# Patient Record
Sex: Male | Born: 1985 | Race: White | Hispanic: No | Marital: Single | Smoking: Current every day smoker
Health system: Southern US, Community
[De-identification: ages and names within clinical notes are randomized; demographics above are authoritative.]

## PROBLEM LIST (undated history)

## (undated) DIAGNOSIS — I10 Essential (primary) hypertension: Secondary | ICD-10-CM

## (undated) HISTORY — DX: Essential (primary) hypertension: I10

---
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
## 2004-05-29 ENCOUNTER — Emergency Department (HOSPITAL_COMMUNITY): Admission: EM | Admit: 2004-05-29 | Discharge: 2004-05-29 | Payer: Self-pay | Admitting: Family Medicine

## 2009-09-04 ENCOUNTER — Ambulatory Visit: Payer: Self-pay | Admitting: Occupational Medicine

## 2009-09-04 DIAGNOSIS — J45909 Unspecified asthma, uncomplicated: Secondary | ICD-10-CM | POA: Insufficient documentation

## 2009-09-18 ENCOUNTER — Encounter (INDEPENDENT_AMBULATORY_CARE_PROVIDER_SITE_OTHER): Payer: Self-pay | Admitting: *Deleted

## 2009-09-18 ENCOUNTER — Ambulatory Visit: Payer: Self-pay | Admitting: Occupational Medicine

## 2010-02-03 ENCOUNTER — Ambulatory Visit: Payer: Self-pay | Admitting: Pulmonary Disease

## 2010-02-03 ENCOUNTER — Inpatient Hospital Stay (HOSPITAL_COMMUNITY): Admission: EM | Admit: 2010-02-03 | Discharge: 2010-02-05 | Payer: Self-pay | Admitting: Emergency Medicine

## 2010-02-12 ENCOUNTER — Ambulatory Visit: Payer: Self-pay | Admitting: Pulmonary Disease

## 2010-02-12 ENCOUNTER — Encounter: Payer: Self-pay | Admitting: Pulmonary Disease

## 2010-02-12 DIAGNOSIS — J159 Unspecified bacterial pneumonia: Secondary | ICD-10-CM | POA: Insufficient documentation

## 2010-02-12 DIAGNOSIS — G4730 Sleep apnea, unspecified: Secondary | ICD-10-CM | POA: Insufficient documentation

## 2010-02-12 DIAGNOSIS — J479 Bronchiectasis, uncomplicated: Secondary | ICD-10-CM

## 2010-02-12 DIAGNOSIS — I10 Essential (primary) hypertension: Secondary | ICD-10-CM | POA: Insufficient documentation

## 2010-02-16 ENCOUNTER — Telehealth (INDEPENDENT_AMBULATORY_CARE_PROVIDER_SITE_OTHER): Payer: Self-pay | Admitting: *Deleted

## 2010-03-23 ENCOUNTER — Ambulatory Visit: Payer: Self-pay | Admitting: Pulmonary Disease

## 2010-04-20 ENCOUNTER — Telehealth (INDEPENDENT_AMBULATORY_CARE_PROVIDER_SITE_OTHER): Payer: Self-pay | Admitting: *Deleted

## 2010-04-23 ENCOUNTER — Ambulatory Visit: Payer: Self-pay | Admitting: Pulmonary Disease

## 2010-05-25 ENCOUNTER — Ambulatory Visit: Payer: Self-pay | Admitting: Pulmonary Disease

## 2010-05-25 ENCOUNTER — Telehealth: Payer: Self-pay | Admitting: Pulmonary Disease

## 2010-05-25 DIAGNOSIS — F411 Generalized anxiety disorder: Secondary | ICD-10-CM

## 2010-08-24 ENCOUNTER — Ambulatory Visit: Admit: 2010-08-24 | Payer: Self-pay | Admitting: Pulmonary Disease

## 2010-08-27 ENCOUNTER — Telehealth: Payer: Self-pay | Admitting: Adult Health

## 2010-08-28 NOTE — Progress Notes (Signed)
Summary: change from  symbicort---insurance will not cover  Phone Note Other Incoming   Summary of Call: prior  auth for symbicort 160-/4.5.  Follow-up for Phone Call        called to initiate prior auth for the symbicort---this is not a preferred drug and the insurance will not cover this med for refills---insurance will cover---asmanex, alvesco, qvar, adviar HFA, adviar discus and dulera---which med would you like to change pt to?  please advise. thanks Randell Loop CMA  May 25, 2010 4:37 PM   Additional Follow-up for Phone Call Additional follow up Details #1::        pt has used asmanex without results - pl initiate prior auth - nEEDS symbicort 160/4.5 Additional Follow-up by: Comer Locket. Vassie Loll MD,  May 25, 2010 4:55 PM    Additional Follow-up for Phone Call Additional follow up Details #2::    called back to recheck on the prior auth for the symbicort---they are telling me even though he has used the asmanex before  his insurance will only cover a total of 3 inhalers of the symbicort and there is no way to do a prior auth for the symbicort.  please advise Randell Loop [facility]  May 28, 2010 10:22 AM   ok let's get 3 at least Follow-up by: Comer Locket. Vassie Loll MD,  May 28, 2010 1:57 PM  Additional Follow-up for Phone Call Additional follow up Details #3:: Details for Additional Follow-up Action Taken: called spoke with pharmacist at [facility].  he stated that insurance is still rejecting the symbicort.  leigh do you still have the sheet with the PA info? Boone Master CNA/MA  May 28, 2010 2:34 PM   I called the insurance company again to see what can be done and they gave me a number to customer support at [phone] but they do not open until 10am so WCB. Carron Curie CMA  May 29, 2010 9:21 AM  Spoke to pharmacy support and they state the symbicort needs to be sent through mail order in order to be covered.  Spoke with pt and advvised. He states he has plenty of  inhaler at this time and will call whenhe needs a refill. Carron Curie CMA  May 29, 2010 4:20 PM

## 2010-08-28 NOTE — Letter (Signed)
Summary: Out of Work  [facility]  [address]   [place], [place] [address]   Phone: [phone]  Fax: [phone]    September 18, 2009   Employee:  Roy [employer] [facility]    To Whom It May Concern:   For Medical reasons, please excuse the above named employee from work for the following dates:  Start:   09/18/09  End:   09/21/09  If you need additional information, please feel free to contact our office.         Sincerely,    Logan Bores, MD

## 2010-08-28 NOTE — Progress Notes (Signed)
Summary: symbicort changed to asmanex  Phone Note Call from Patient Call back at [phone]   Caller: Patient Call For: alva Summary of Call: Pt states his insurance won't cover symbicort, needs an alternative, pls advise.//[facility] Initial call taken by: Darletta Moll,  April 20, 2010 8:31 AM  Follow-up for Phone Call        [id number] to advise pt to call ins and get formulary. Carron Curie CMA  April 20, 2010 10:08 AM  per EMR chart, qvar, alvesco and asmanex are on pt's formulary.  pt returned call, informed him that RA will have to approve an alternative inhaler.  pt is completely out of symbicort.  will provide 1 sample until alternative is found.  pt verbalized his understanding.  will forward to RA for recs. Follow-up by: Boone Master CNA/MA,  April 20, 2010 11:15 AM  Additional Follow-up for Phone Call Additional follow up Details #1::        how about advair 250/50 two times a day ? If not , then have him get asmanex 220 2 puffs bid Additional Follow-up by: Comer Locket. Vassie Loll MD,  April 20, 2010 11:30 AM    Additional Follow-up for Phone Call Additional follow up Details #2::    advair off formulary.  symbicort changed to asmanex 220, 2 puffs two times a day.  called spoke with patient, informed him of this.  pt verbalized his understanding.  told pt that since his med is being changed, no sample left up front for pt.  rx sent to [facility]. Follow-up by: Boone Master CNA/MA,  April 20, 2010 11:39 AM  New/Updated Medications: ASMANEX 120 METERED DOSES 220 MCG/INH AEPB (MOMETASONE FUROATE) Inhale 2 puffs two times a day Prescriptions: ASMANEX 120 METERED DOSES 220 MCG/INH AEPB (MOMETASONE FUROATE) Inhale 2 puffs two times a day  #30days x 3   Entered by:   Boone Master CNA/MA   Authorized by:   Comer Locket. Vassie Loll MD   Signed by:   Boone Master CNA/MA on 04/20/2010   Method used:   Electronically to        [facility]*  (retail)       [address] [place], [place]  [address]       Ph: [phone] or [phone]       Fax: [phone]   RxID:   [id number]

## 2010-08-28 NOTE — Assessment & Plan Note (Signed)
Summary: cough, sinus pressure, hoarseness x 2 dys rm 3   Vital Signs:  Patient Profile:   25 Years Old Male CC:      Cold & URI symptoms Height:     71.5 inches Weight:      228 pounds O2 Sat:      97 % O2 treatment:    Room Air Temp:     97.1 degrees F oral Pulse rate:   115 / minute Pulse rhythm:   regular Resp:     16 per minute BP sitting:   148 / 86  (right arm) Cuff size:   regular  Vitals Entered By: Areta Haber CMA (September 18, 2009 4:07 PM)                  Current Allergies: No known allergies History of Present Illness Chief Complaint: Cold & URI symptoms History of Present Illness: Presents with complaints of sore throat, fever, dificulty swallowing for 3 days.   Also complains of sinus congestion and cough.   He was in here about 2 weeks ago with flu like symptoms.   No antibiotc use recently.    Current Problems: STREP THROAT (ICD-034.0) INFLUENZA (ICD-487.8) ASTHMA (ICD-493.90)   Current Meds DAYQUIL MULTI-SYMPTOM 30-325-10 MG/15ML LIQD (PSEUDOEPHEDRINE-APAP-DM) as needed X 3 days SUDAFED 30 MG TABS (PSEUDOEPHEDRINE HCL) as needed X 3 days CHERATUSSIN AC 100-10 MG/5ML SYRP (GUAIFENESIN-CODEINE) 5 ml every 6-8 hours for cough AFRIN NASAL SPRAY 0.05 % SOLN (OXYMETAZOLINE HCL) As directed IBUPROFEN 200 MG TABS (IBUPROFEN) As directed PENICILLIN V POTASSIUM 500 MG TABS (PENICILLIN V POTASSIUM) one tablet every 12 hours for strep throat TESSALON PERLES 100 MG  CAPS (BENZONATATE) one capsule every 6 hours for cough  REVIEW OF SYSTEMS Constitutional Symptoms      Denies fever, chills, night sweats, weight loss, weight gain, and fatigue.  Eyes       Denies change in vision, eye pain, eye discharge, glasses, contact lenses, and eye surgery. Ear/Nose/Throat/Mouth       Complains of frequent runny nose, sinus problems, sore throat, and hoarseness.      Denies hearing loss/aids, change in hearing, ear pain, ear discharge, dizziness, frequent nose bleeds,  and tooth pain or bleeding.      Comments: x 2 dys Respiratory       Complains of wheezing and shortness of breath.      Denies dry cough, productive cough, asthma, bronchitis, and emphysema/COPD.  Cardiovascular       Denies murmurs, chest pain, and tires easily with exhertion.    Gastrointestinal       Denies stomach pain, nausea/vomiting, diarrhea, constipation, blood in bowel movements, and indigestion. Genitourniary       Denies painful urination, kidney stones, and loss of urinary control. Neurological       Denies paralysis, seizures, and fainting/blackouts. Musculoskeletal       Denies muscle pain, joint pain, joint stiffness, decreased range of motion, redness, swelling, muscle weakness, and gout.  Skin       Denies bruising, unusual mles/lumps or sores, and hair/skin or nail changes.  Psych       Denies mood changes, temper/anger issues, anxiety/stress, speech problems, depression, and sleep problems. Other Comments: Pt states he was pulling up floors this weekend that was molded and water logged.    Past History:  Past Medical History: Last updated: 09/04/2009 Asthma  Past Surgical History: Last updated: 09/04/2009 Denies surgical history  Family History: Last updated: 09/04/2009 unknown  Social  History: Last updated: 09/04/2009 Occupation:plumber Single Current Smoker 1pp/week Alcohol use-yes 12 beers/week Drug use-no Regular exercise-no  Risk Factors: Exercise: no (09/04/2009)  Risk Factors: Smoking Status: current (09/04/2009) Physical Exam General appearance: well developed, well nourished, no acute distress Ears: normal, no lesions or deformities Nasal: swollen red turbinates with congestion Oral/Pharynx: pharyngeal erythema with exudate, uvula midline without deviation Chest/Lungs: no rales, wheezes, or rhonchi bilateral, breath sounds equal without effort Heart: regular rate and  rhythm, no murmur Assessment New Problems: STREP THROAT  (ICD-034.0)   Plan New Medications/Changes: TESSALON PERLES 100 MG  CAPS (BENZONATATE) one capsule every 6 hours for cough  #20 x 0, 09/18/2009, Kathrine Haddock MD PENICILLIN V POTASSIUM 500 MG TABS (PENICILLIN V POTASSIUM) one tablet every 12 hours for strep throat  #20 x 0, 09/18/2009, Kathrine Haddock MD  New Orders: Est. Patient Level III [phone]  The patient and/or caregiver has been counseled thoroughly with regard to medications prescribed including dosage, schedule, interactions, rationale for use, and possible side effects and they verbalize understanding.  Diagnoses and expected course of recovery discussed and will return if not improved as expected or if the condition worsens. Patient and/or caregiver verbalized understanding.  Prescriptions: TESSALON PERLES 100 MG  CAPS (BENZONATATE) one capsule every 6 hours for cough  #20 x 0   Entered and Authorized by:   Kathrine Haddock MD   Signed by:   Kathrine Haddock MD on 09/18/2009   Method used:   Print then Give to Patient   RxID:   [id number] PENICILLIN V POTASSIUM 500 MG TABS (PENICILLIN V POTASSIUM) one tablet every 12 hours for strep throat  #20 x 0   Entered and Authorized by:   Kathrine Haddock MD   Signed by:   Kathrine Haddock MD on 09/18/2009   Method used:   Print then Give to Patient   RxID:   [id number]   Patient Instructions: 1)  Take your antibiotic as prescribed until ALL of it is gone, but stop if you develop a rash or swelling and contact our office as soon as possible.

## 2010-08-28 NOTE — Miscellaneous (Signed)
Summary: Orders Update pft charges  Clinical Lists Changes  Orders: Added new Service order of Carbon Monoxide diffusing w/capacity (94720) - Signed Added new Service order of Lung Volumes (94240) - Signed Added new Service order of Spirometry (Pre & Post) (94060) - Signed 

## 2010-08-28 NOTE — Assessment & Plan Note (Signed)
Summary: POSS FLU/TJ   Vital Signs:  Patient Profile:   25 Years Old Male CC:      flu-like symptoms X 3days Height:     71.5 inches Weight:      229 pounds O2 Sat:      98 % O2 treatment:    Room Air Temp:     99.1 degrees F oral Pulse rate:   103 / minute Pulse rhythm:   regular Resp:     18 per minute BP sitting:   152 / 102  (right arm)  Pt. in pain?   yes    Location:   abdomen    Intensity:   5    Type:       sharp  Vitals Entered By: Lita Mains RN                   Updated Prior Medication List: DAYQUIL MULTI-SYMPTOM 30-325-10 MG/15ML LIQD (PSEUDOEPHEDRINE-APAP-DM) as needed X 3 days SUDAFED 30 MG TABS (PSEUDOEPHEDRINE HCL) as needed X 3 days  Current Allergies: No known allergies History of Present Illness History from: patient Chief Complaint: flu-like symptoms X 3days History of Present Illness: Presents with complaints of dry cough, generalized body aches, sinus congestion and diarrhea for 3 days.   No reports of vomiting.    Cough keeping him up at night.   No reports of fever.     REVIEW OF SYSTEMS Constitutional Symptoms       Complains of fever and chills.     Denies night sweats, weight loss, weight gain, and fatigue.  Eyes       Denies change in vision, eye pain, eye discharge, glasses, contact lenses, and eye surgery. Ear/Nose/Throat/Mouth       Complains of ear pain, frequent runny nose, sinus problems, and hoarseness.      Denies hearing loss/aids, change in hearing, ear discharge, dizziness, frequent nose bleeds, sore throat, and tooth pain or bleeding.  Respiratory       Complains of productive cough.      Denies dry cough, wheezing, shortness of breath, asthma, bronchitis, and emphysema/COPD.      Comments: yellow/green Cardiovascular       Denies murmurs, chest pain, and tires easily with exhertion.    Gastrointestinal       Complains of diarrhea.      Denies stomach pain, nausea/vomiting, constipation, blood in bowel movements, and  indigestion. Genitourniary       Denies painful urination, kidney stones, and loss of urinary control. Neurological       Denies paralysis, seizures, and fainting/blackouts. Musculoskeletal       Complains of muscle pain.      Denies joint pain, joint stiffness, decreased range of motion, redness, swelling, muscle weakness, and gout.      Comments: body aches Skin       Denies bruising, unusual mles/lumps or sores, and hair/skin or nail changes.  Psych       Denies mood changes, temper/anger issues, anxiety/stress, speech problems, depression, and sleep problems. Other Comments: Symptoms have lasted three days getting progressively worse.   Past History:  Past Medical History: Asthma  Past Surgical History: Denies surgical history  Family History: unknown  Social History: Occupation:plumber Single Current Smoker 1pp/week Alcohol use-yes 12 beers/week Drug use-no Regular exercise-no Smoking Status:  current Drug Use:  no Does Patient Exercise:  no Physical Exam General appearance: well developed, well nourished, no acute distress Pupils: equal, round, reactive to light Ears:  normal, no lesions or deformities Nasal: mucosa pink, nonedematous, no septal deviation, turbinates normal Oral/Pharynx: tongue normal, posterior pharynx without erythema or exudate Neck: neck supple,  trachea midline, no masses Chest/Lungs: no rales, wheezes, or rhonchi bilateral, breath sounds equal without effort Heart: regular rate and  rhythm, no murmur Assessment New Problems: INFLUENZA (ICD-487.8) ASTHMA (ICD-493.90)   Plan New Medications/Changes: CHERATUSSIN AC 100-10 MG/5ML SYRP (GUAIFENESIN-CODEINE) 5 ml every 6-8 hours for cough  #4 oz x 0, 09/04/2009, Kathrine Haddock MD  New Orders: New Patient Level III [phone] Planning Comments:   supportive care Prescription cough suppressant Off work until [employer]   The patient and/or caregiver has been counseled thoroughly with regard to  medications prescribed including dosage, schedule, interactions, rationale for use, and possible side effects and they verbalize understanding.  Diagnoses and expected course of recovery discussed and will return if not improved as expected or if the condition worsens. Patient and/or caregiver verbalized understanding.  Prescriptions: CHERATUSSIN AC 100-10 MG/5ML SYRP (GUAIFENESIN-CODEINE) 5 ml every 6-8 hours for cough  #4 oz x 0   Entered and Authorized by:   Kathrine Haddock MD   Signed by:   Kathrine Haddock MD on 09/04/2009   Method used:   Print then Give to Patient   RxID:   [id number]
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
## 2010-08-28 NOTE — Letter (Signed)
Summary: Out of Work  [facility]  [address]   [place], [place] [address]   Phone: [phone]  Fax: [phone]    February 12, 2010   Employee:  Roy [employer] [facility]    To Whom It May Concern:   For Medical reasons, please excuse the above named employee from work for the following dates:  Start:   February 03, 2010  End:   February 12, 2010 (may return to work on February 13, 2010)  If you need additional information, please feel free to contact our office.         Sincerely,    Cyril Mourning, M.D.

## 2010-08-28 NOTE — Progress Notes (Signed)
Summary: refill on ecdocet - no refill needed, just records  Phone Note From Other Clinic   Caller: Nurse Call For: Dr. Shelah Lewandowsky Reason for Call: Medication Check Request: Talk with Nurse Details for Reason: refill information Summary of Call: During my lunch time, a message was taken from [place] with Dr. Hennie Duos office. She called stating that they needed refill information so that they could begin filling the medication for the patient.  Follow-up for Phone Call        Dr. Hennie Duos office called again wanting information on the patient. Dr. Wardell Heath also works with Dr. Jeannetta Nap so I faxed the office note so that they can follow up with the patient concerning his medication refill. Follow-up by: Wilder Glade,  February 16, 2010 4:16 PM  Additional Follow-up for Phone Call Additional follow up Details #1::        Dr. Vassie Loll is not going to fill Endocet.Michel Bickers [facility]  February 16, 2010 5:34 PM  Lawson Fiscal, do you have any idea where this message came from?  there are no names attached and no call back numbers.   Boone Master CNA/MA  February 19, 2010 9:10 AM     Additional Follow-up for Phone Call Additional follow up Details #2::    Dena in medical records forwarded it to me because I was working with Dr. Vassie Loll that day. They somehow received the call down there and someone told her to forward it to me. Other than that, I have no information. Dr. Vassie Loll is not going to fill the Endocet.Michel Bickers [facility]  February 19, 2010 9:24 AM  called spoke with Steward Drone at Dr. Levon Hedger office.  she states that she just needed records from our office and that she received them "late Friday afternoon."  nothing else needed per Steward Drone.  will sign off on message. Boone Master CNA/MA  February 19, 2010 9:44 AM

## 2010-08-28 NOTE — Assessment & Plan Note (Signed)
Summary: f/u pft results ///kp   Visit Type:  Follow-up Copy to:  hospital  Primary Provider/Referring Provider:  Dr. Janit Pagan, [facility]  CC:  2 month follow up... Requesting to go back on Symbicort unable to [place] Asmanex.  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 Central bronchectasis with mucoid plugs on CT chest  & high IgE levels , h/o asthma very suggestive of ABPA. However, aspergillus ABs neg. IgE 486, RAST - cat & dog dander extremely high, meadow grass +,  Aspergillus Ab neg,  March 23, 2010 4:17 PM  Was off prednisone x 3 weeks , now symptoms back co-workers sick at work with bronchitis, yellow phlegm, blood tinged, Compliant with symbicort  May 25, 2010 12:16 PM  smokes 5-10 cigs/d, no wheezing, dyspnea Insurance would not approve symbisort,  made him switch to asmanex made him feel wierd, off steroids x 2 months  ran out of klonopin for anxiety - needs for 1 day until PMD fills his RX PFTs showed FEv1 76%, ratio nml, , borderline BD response in smaller airways  Preventive Screening-Counseling & Management  Alcohol-Tobacco     Alcohol drinks/day: <1     Alcohol type: beer     Smoking Status: current     Packs/Day: 0.25     Year Started: 2004     Cans of tobacco/week: 1  Caffeine-Diet-Exercise     Does Patient Exercise: no  Current Medications (verified): 1)  Multivitamins   Tabs (Multiple Vitamin) .... Take 1 Tablet By Mouth Once A Day 2)  Klonopin 0.5 Mg Tabs (Clonazepam) .... Take 1 Tablet By Mouth Every 8 Hours 3)  Atenolol 50 Mg Tabs (Atenolol) .... Take 1 Tablet By Mouth Once A Day 4)  Ventolin Hfa 108 (90 Base) Mcg/act Aers (Albuterol Sulfate) .... As Needed  Allergies (verified): No Known Drug Allergies  Past History:  Past Medical History: Last updated: 02/12/2010 HYPERTENSION  (ICD-401.9) ASTHMA (ICD-493.90)    Social History: Last updated: 09/04/2009 Occupation:plumber Single Current Smoker 1pp/week Alcohol use-yes 12 beers/week Drug use-no Regular exercise-no  Risk Factors: Smoking Status: current (05/25/2010) Packs/Day: 0.25 (05/25/2010) Cans of tobacco/wk: 1 (05/25/2010)  Review of Systems  The patient denies anorexia, fever, weight loss, weight gain, vision loss, decreased hearing, hoarseness, chest pain, syncope, dyspnea on exertion, peripheral edema, prolonged cough, headaches, hemoptysis, abdominal pain, melena, hematochezia, severe indigestion/heartburn, hematuria, muscle weakness, suspicious skin lesions, transient blindness, difficulty walking, depression, unusual weight change, abnormal bleeding, enlarged lymph nodes, and angioedema.    Vital Signs:  Patient profile:   25 year old male Height:      71.5 inches Weight:      227.2 pounds BMI:     31.36 O2 Sat:      95 % on Room air Temp:     98.5 degrees F oral Pulse rate:   102 / minute BP sitting:   130 / 82  (left arm) Cuff size:   large  Vitals Entered By: Zackery Barefoot CMA (May 25, 2010 11:51 AM)  O2 Flow:  Room air CC: 2 month follow up... Requesting to go back on Symbicort unable to [place] Asmanex Comments Medications reviewed with patient Verified contact number and pharmacy with patient Zackery Barefoot [facility]  May 25, 2010 11:53 AM    Physical Exam  Additional Exam:  pleasant, obese 215 lbs --> 227 May 25, 2010  HEENT - no thrush, no post nasal drip, class 3 airway , enlarged tonsils no lymphadenopathy CVS- s1s2 nml, no murmur, no JVD RS- clear, no crackles, faint  rhonchi Abd- soft, non-tender, no organomegaly Ext - no clubbing, cyanosis or edema    Impression & Recommendations:  Problem # 1:  ASTHMA (ICD-493.90) Has not tolerated asmanex Given recurent steroid tapers, does need steroid-LABA combination such as symbicort. 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 psych consult His updated medication list for this problem includes:    Klonopin 0.5 Mg Tabs (Clonazepam) .Marland Kitchen... Take 1 tablet by mouth every 8 hours    Clonazepam 0.5 Mg Tbdp (Clonazepam) .Marland Kitchen... Three times a day as needed  Orders: Est. Patient Level IV (04540) Prescription Created Electronically [id number])  Problem # 4:  OBSTRUCTIVE SLEEP APNEA (ICD-780.57)  sleep study once resp issues resolved  Orders: Est. Patient Level IV (14782) Prescription Created Electronically [phone])  Medications Added to Medication List This Visit: 1)  Klonopin 0.5 Mg Tabs (Clonazepam) .... Take 1 tablet by mouth every 8 hours 2)  Symbicort 160-4.5 Mcg/act Aero (Budesonide-formoterol fumarate) .... 2 puffs two times a day 3)  Clonazepam 0.5 Mg Tbdp (Clonazepam) .... Three times a day as needed  Patient Instructions: 1)  Copy sent to: dr Wardell Heath 2)  Please schedule a follow-up appointment in 3 months with TP. 3)  Symbicort 160  - we will fill out prior authorisation 4)  Do not run out of medications Prescriptions: CLONAZEPAM 0.5 MG TBDP (CLONAZEPAM) three times a day as needed  #6 x 0   Entered and Authorized by:   Comer Locket. Vassie Loll MD   Signed by:   Comer Locket Vassie Loll MD on 05/25/2010   Method used:   Print then Give to Patient   RxID:   [id number] SYMBICORT 160-4.5 MCG/ACT AERO (BUDESONIDE-FORMOTEROL FUMARATE) 2 puffs two times a day  #1 x 5   Entered and Authorized by:   Comer Locket. Vassie Loll MD   Signed by:   Comer Locket Vassie Loll MD on 05/25/2010   Method used:   Electronically to        [facility]* (retail)       [address]  [place], [place]  [address]       Ph: [phone] or [phone]       Fax: [phone]   RxID:   [id number]     Not Administered:    Influenza Vaccine not given due to: declined

## 2010-08-28 NOTE — Progress Notes (Signed)
Summary: refill  Phone Note Call from Patient Call back at [phone]   Caller: Patient Call For: alva Reason for Call: Refill Medication Summary of Call: Refill on endocet 5-325mg .//[facility] Initial call taken by: Darletta Moll,  February 16, 2010 11:04 AM  Follow-up for Phone Call        Pt just given rx for this on 02/12/10- # 20 1 every 12 hrs as needed.  He should not need this filled yet, too soon.  LMOMTCB Vernie Murders  February 16, 2010 11:12 AM  Spoke with pt.  He states that he has been needing to take endocet every 6 hours to help with the pain he is having.  He states that he is used to taking the higher dose of endocet and this is why.  Please advise if okay to refill this or change rx.  Thanks! Follow-up by: Vernie Murders,  February 16, 2010 11:17 AM  Additional Follow-up for Phone Call Additional follow up Details #1::        He needs to call his medical Dr for more meds. he should now be able to get by with OTC meds such as tylenol or motrin/advil Additional Follow-up by: Comer Locket. Vassie Loll MD,  February 16, 2010 12:49 PM    Additional Follow-up for Phone Call Additional follow up Details #2::    Called, spoke with pt.  Pt informed of above statement per RA.  He verbalized understanding.  Gweneth Dimitri RN  February 16, 2010 1:46 PM
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
## 2010-09-05 NOTE — Progress Notes (Signed)
Summary: nos appt  Phone Note Call from Patient   Caller: [email or web address]  Call For: parrett Summary of Call: In ref to nos from 1/27 pt states he will call to [facility]. Initial call taken by: Darletta Moll,  August 27, 2010 10:35 AM

## 2010-10-14 LAB — COMPREHENSIVE METABOLIC PANEL
Albumin: 3.7 g/dL (ref 3.5–5.2)
Alkaline Phosphatase: 82 U/L (ref 39–117)
BUN: 16 mg/dL (ref 6–23)
Calcium: 8 mg/dL — ABNORMAL LOW (ref 8.4–10.5)
Chloride: 108 mEq/L (ref 96–112)
GFR calc non Af Amer: >60 mL/min (ref >60)
Glucose, Bld: 105 mg/dL — ABNORMAL HIGH (ref 70–99)
Sodium: 137 mEq/L (ref 135–145)
Total Bilirubin: 1.5 mg/dL — ABNORMAL HIGH (ref 0.3–1.2)
Total Protein: 6.6 g/dL (ref 6.0–8.3)

## 2010-10-14 LAB — CBC
HCT: 41.3 % (ref 39.0–52.0)
HCT: 42.8 % (ref 39.0–52.0)
Hemoglobin: 14.5 g/dL (ref 13.0–17.0)
Hemoglobin: 15 g/dL (ref 13.0–17.0)
MCH: 32.2 pg (ref 26.0–34.0)
MCH: 32.2 pg (ref 26.0–34.0)
MCHC: 35.1 g/dL (ref 30.0–36.0)
Platelets: 276 10*3/uL (ref 150–400)
RBC: 5.25 MIL/uL (ref 4.22–5.81)
WBC: 13.5 10*3/uL — ABNORMAL HIGH (ref 4.0–10.5)

## 2010-10-14 LAB — ASPERGILLUS ANTIBODY BY IMMUNODIFF: Aspergillus Antibody ID: NEGATIVE

## 2010-10-14 LAB — POCT I-STAT 3, ART BLOOD GAS (G3+)
Acid-base deficit: 6 mmol/L — ABNORMAL HIGH (ref 0.0–2.0)
pCO2 arterial: 45.1 mmHg — ABNORMAL HIGH (ref 35.0–45.0)
pH, Arterial: 7.277 — ABNORMAL LOW (ref 7.350–7.450)
pO2, Arterial: 82 mmHg (ref 80.0–100.0)

## 2010-10-14 LAB — BASIC METABOLIC PANEL
CO2: 23 mEq/L (ref 19–32)
GFR calc non Af Amer: >60 mL/min (ref >60)
GFR calc non Af Amer: >60 mL/min (ref >60)
Glucose, Bld: 146 mg/dL — ABNORMAL HIGH (ref 70–99)
Potassium: 3.9 mEq/L (ref 3.5–5.1)
Potassium: 4 mEq/L (ref 3.5–5.1)
Sodium: 139 mEq/L (ref 135–145)
Sodium: 140 mEq/L (ref 135–145)

## 2010-10-14 LAB — URINE DRUGS OF ABUSE SCREEN W ALC, ROUTINE (REF LAB)
Amphetamine Screen, Ur: NEGATIVE
Cocaine Metabolites: NEGATIVE
Marijuana Metabolite: NEGATIVE
Methadone: NEGATIVE
Propoxyphene: NEGATIVE

## 2010-10-14 LAB — ABO/RH: ABO/RH(D): A NEG

## 2010-10-14 LAB — DIFFERENTIAL
Basophils Absolute: 0.1 10*3/uL (ref 0.0–0.1)
Basophils Relative: 0 % (ref 0–1)
Eosinophils Absolute: 0 10*3/uL (ref 0.0–0.7)
Lymphocytes Relative: 5 % — ABNORMAL LOW (ref 12–46)
Lymphs Abs: 1.3 10*3/uL (ref 0.7–4.0)
Monocytes Relative: 3 % (ref 3–12)
Neutro Abs: 13.1 10*3/uL — ABNORMAL HIGH (ref 1.7–7.7)

## 2010-10-14 LAB — HEPATIC FUNCTION PANEL
ALT: 45 U/L (ref 0–53)
Albumin: 3.4 g/dL — ABNORMAL LOW (ref 3.5–5.2)
Total Bilirubin: 1.5 mg/dL — ABNORMAL HIGH (ref 0.3–1.2)
Total Protein: 6.3 g/dL (ref 6.0–8.3)

## 2010-10-14 LAB — POCT I-STAT, CHEM 8
Chloride: 107 mEq/L (ref 96–112)
Potassium: 5.3 mEq/L — ABNORMAL HIGH (ref 3.5–5.1)
Sodium: 137 mEq/L (ref 135–145)
TCO2: 22 mmol/L (ref 0–100)

## 2010-10-14 LAB — GLUCOSE, CAPILLARY

## 2010-10-14 LAB — CULTURE, BLOOD (ROUTINE X 2)
Culture: NO GROWTH
Culture: NO GROWTH

## 2010-10-14 LAB — LEGIONELLA ANTIGEN, URINE: Legionella Antigen, Urine: NEGATIVE

## 2010-10-14 LAB — OPIATE, QUANTITATIVE, URINE
Codeine Urine: NEGATIVE NG/ML
Hydrocodone: NEGATIVE NG/ML
Hydromorphone GC/MS Conf: NEGATIVE NG/ML
Oxymorphone: NEGATIVE NG/ML

## 2010-10-14 LAB — CORTISOL: Cortisol, Plasma: 35.7 ug/dL

## 2010-10-14 LAB — SEDIMENTATION RATE: Sed Rate: 3 mm/hr (ref 0–16)

## 2010-10-14 LAB — TYPE AND SCREEN: ABO/RH(D): A NEG

## 2010-10-14 LAB — MRSA PCR SCREENING: MRSA by PCR: NEGATIVE

## 2010-12-08 ENCOUNTER — Emergency Department (HOSPITAL_COMMUNITY)
Admission: EM | Admit: 2010-12-08 | Discharge: 2010-12-09 | Disposition: A | Discharge status: Home / Self Care | Payer: Self-pay | Attending: Emergency Medicine | Admitting: Emergency Medicine

## 2010-12-08 DIAGNOSIS — R000 Tachycardia, unspecified: Secondary | ICD-10-CM | POA: Insufficient documentation

## 2010-12-08 DIAGNOSIS — F411 Generalized anxiety disorder: Secondary | ICD-10-CM | POA: Insufficient documentation

## 2010-12-09 LAB — RAPID URINE DRUG SCREEN, HOSP PERFORMED
Barbiturates: NOT DETECTED
Cocaine: POSITIVE — AB
Opiates: NOT DETECTED
Tetrahydrocannabinol: NOT DETECTED

## 2011-09-18 ENCOUNTER — Telehealth: Payer: Self-pay | Admitting: Pulmonary Disease

## 2011-09-18 MED ORDER — BUDESONIDE-FORMOTEROL FUMARATE 160-4.5 MCG/ACT IN AERO: 2.0000 | INHALATION_SPRAY | Freq: Two times a day (BID) | RESPIRATORY_TRACT | Status: DC

## 2011-09-18 NOTE — Telephone Encounter (Signed)
Spoke with pt. Advised overdue for follow up here, and so will need appt scheduled and will give him 1 inhaler to last until then. Pt agrees to keep appt. I have scheduled him to see TP for 09/25/11 at 11:45 am. I sent refill x 1 only.

## 2011-09-24 ENCOUNTER — Encounter: Payer: Self-pay | Admitting: Adult Health

## 2011-09-25 ENCOUNTER — Ambulatory Visit: Payer: Self-pay | Admitting: Adult Health

## 2011-09-27 ENCOUNTER — Ambulatory Visit (INDEPENDENT_AMBULATORY_CARE_PROVIDER_SITE_OTHER): Payer: Self-pay | Admitting: Adult Health

## 2011-09-27 DIAGNOSIS — J159 Unspecified bacterial pneumonia: Secondary | ICD-10-CM

## 2011-10-04 NOTE — Progress Notes (Signed)
No show

## 2012-02-22 ENCOUNTER — Encounter (HOSPITAL_COMMUNITY): Payer: Self-pay | Admitting: *Deleted

## 2012-02-22 ENCOUNTER — Emergency Department (HOSPITAL_COMMUNITY)
Admission: EM | Admit: 2012-02-22 | Discharge: 2012-02-22 | Disposition: A | Discharge status: Home / Self Care | Payer: Self-pay | Attending: Emergency Medicine | Admitting: Emergency Medicine

## 2012-02-22 DIAGNOSIS — J45909 Unspecified asthma, uncomplicated: Secondary | ICD-10-CM | POA: Insufficient documentation

## 2012-02-22 DIAGNOSIS — F17200 Nicotine dependence, unspecified, uncomplicated: Secondary | ICD-10-CM | POA: Insufficient documentation

## 2012-02-22 DIAGNOSIS — I10 Essential (primary) hypertension: Secondary | ICD-10-CM | POA: Insufficient documentation

## 2012-02-22 DIAGNOSIS — F1120 Opioid dependence, uncomplicated: Secondary | ICD-10-CM | POA: Insufficient documentation

## 2012-02-22 MED ORDER — METHADONE HCL 10 MG PO TABS
100.0000 mg | ORAL_TABLET | Freq: Once | ORAL | Status: AC
  Administered 2012-02-22: 100 mg via ORAL
  Filled 2012-02-22: qty 10

## 2012-02-22 MED ORDER — ATENOLOL 50 MG PO TABS: 25.0000 mg | ORAL_TABLET | Freq: Every day | ORAL | Status: DC

## 2012-02-22 NOTE — ED Notes (Signed)
Reports needing his methadone for today. Was suppose to go to [facility] this am but was late and unable to get the meds. No acute distress noted at this time.

## 2012-02-22 NOTE — ED Provider Notes (Signed)
History   This chart was scribed for Gerhard Munch, MD by Charolett Bumpers . The patient was seen in room TR04C/TR04C. Patient's care was started at 11:02.    CSN: [id number]  Arrival date & time 02/22/12  0946   First MD Initiated Contact with Patient 02/22/12 1102      Chief Complaint  Patient presents with  . Medication Refill    (Consider location/radiation/quality/duration/timing/severity/associated sxs/prior treatment) HPI Roy Richmond is a 26 y.o. male who presents to the Emergency Department for a methadone refill. Pt states that he took pain pills for 3 years and has been on the Methadone program for the last year. Pt states that he is on 100 mg methadone daily. Pt states that he was unable to get his methadone this morning at [facility] due to being late. Pt denies any complaints of pain at this time. Pt also reports a h/o HTN and states that he is also out of his Atenolol 25 mg/daily. Pt states that he is otherwise healthy. Pt denies any headaches, pain or confusion.   Past Medical History  Diagnosis Date  . HTN (hypertension)   . Asthma     History reviewed. No pertinent past surgical history.  History reviewed. No pertinent family history.  History  Substance Use Topics  . Smoking status: Current Everyday Smoker -- 0.3 packs/day    Types: Cigarettes  . Smokeless tobacco: Not on file  . Alcohol Use: Yes     12 beers per week      Review of Systems  Constitutional:       Per HPI, otherwise negative  HENT:       Per HPI, otherwise negative  Eyes: Negative.   Respiratory:       Per HPI, otherwise negative  Cardiovascular:       Per HPI, otherwise negative  Gastrointestinal: Negative for vomiting.  Genitourinary: Negative.   Musculoskeletal:       Per HPI, otherwise negative  Skin: Negative.   Neurological: Negative for syncope.    Allergies  Review of patient's allergies indicates no known allergies.  Home Medications   Current  Outpatient Rx  Name Route Sig Dispense Refill  . ATENOLOL 50 MG PO TABS Oral Take 50 mg by mouth daily.    . BUDESONIDE-FORMOTEROL FUMARATE 160-4.5 MCG/ACT IN AERO Inhalation Inhale 2 puffs into the lungs 2 (two) times daily. 1 Inhaler 0    Please advise pt to keep appt for additional refil ...  . METHADONE HCL 10 MG/ML PO CONC Oral Take 100 mg by mouth daily.    . ALBUTEROL SULFATE HFA 108 (90 BASE) MCG/ACT IN AERS Inhalation Inhale 2 puffs into the lungs every 6 (six) hours as needed.      BP 139/74  Pulse 87  Temp 98 F (36.7 C) (Oral)  Resp 16  SpO2 100%  Physical Exam  Nursing note and vitals reviewed. Constitutional: He is oriented to person, place, and time. He appears well-developed. No distress.  HENT:  Head: Normocephalic and atraumatic.  Eyes: Conjunctivae and EOM are normal.  Neck: Normal range of motion.  Cardiovascular: Normal rate, regular rhythm and normal heart sounds.   Pulmonary/Chest: Effort normal and breath sounds normal. No stridor. No respiratory distress.  Abdominal: He exhibits no distension.  Musculoskeletal: He exhibits no edema.  Neurological: He is alert and oriented to person, place, and time.  Skin: Skin is warm and dry.  Psychiatric: He has a normal mood and affect.  ED Course  Procedures (including critical care time)  DIAGNOSTIC STUDIES:   COORDINATION OF CARE:  11:12-Discussed planned course of treatment with the patient, who is agreeable at this time. Pt gave consent to contact [facility].   11:25-Consultation: Spoke with [facility] who confirmed that the pt is one of their patient's and is on a 100 mg Methadone daily.   11:30-Medication Orders: Methadone (Dolophine) tablet 100 mg-once  Labs Reviewed - No data to display No results found.   No diagnosis found.    MDM  I personally performed the services described in this documentation, which was scribed in my presence. The recorded information has been reviewed and  considered.   Is generally well-appearing male presents after missing a methadone clinic appointment.  On exam the patient is in no distress.  The patient's clinic was contacted.  They confirmed the patient is is affiliated area, and discussed today's session.  He was provided today's dose, also provided a refill of his antihypertensive medication.  He was discharged in stable condition.  Gerhard Munch, MD 02/22/12 1221

## 2012-02-29 ENCOUNTER — Emergency Department (HOSPITAL_COMMUNITY)
Admission: EM | Admit: 2012-02-29 | Discharge: 2012-02-29 | Disposition: A | Discharge status: Home / Self Care | Payer: Self-pay | Attending: Emergency Medicine | Admitting: Emergency Medicine

## 2012-02-29 ENCOUNTER — Encounter (HOSPITAL_COMMUNITY): Payer: Self-pay | Admitting: Emergency Medicine

## 2012-02-29 DIAGNOSIS — F1120 Opioid dependence, uncomplicated: Secondary | ICD-10-CM

## 2012-02-29 DIAGNOSIS — F1920 Other psychoactive substance dependence, uncomplicated: Secondary | ICD-10-CM | POA: Insufficient documentation

## 2012-02-29 MED ORDER — METHADONE HCL 10 MG PO TABS
100.0000 mg | ORAL_TABLET | Freq: Once | ORAL | Status: AC
  Administered 2012-02-29: 100 mg via ORAL
  Filled 2012-02-29: qty 10

## 2012-02-29 NOTE — ED Notes (Addendum)
Pt reports working in [place] and missed the [employer] today. Pt denies any pain. Reports feeling hot and cold. Pt reports usually takes medication in the morning. Pt reports that he takes 100 mg once a day. Pt restless in triage. Pt reports Clinic will not be open tomorrow due to being Sunday, so won't have his dose tomorrow either.
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
## 2012-04-27 ENCOUNTER — Ambulatory Visit: Payer: Self-pay | Admitting: Adult Health

## 2012-04-27 ENCOUNTER — Telehealth: Payer: Self-pay | Admitting: Pulmonary Disease

## 2012-04-27 NOTE — Telephone Encounter (Signed)
He needs appt with TP to assess need

## 2012-04-27 NOTE — Telephone Encounter (Signed)
I spoke with pt and is scheduled to come in and see TP today at 4:15

## 2012-04-27 NOTE — Telephone Encounter (Signed)
Pt has not been seen since 05/25/2010 by RA. He cancelled appt with TP on 09/25/11 and NS on 09/27/11. He has no pending appts. Pt was told on 09/18/11 to keep OV with TP for further refills on this medication and never did. Please advise Dr. Vassie Loll if okay to give sample. thanks

## 2012-04-27 NOTE — Telephone Encounter (Signed)
Pt called back again. Roy Richmond °

## 2012-04-28 ENCOUNTER — Ambulatory Visit: Payer: Self-pay | Admitting: Pulmonary Disease

## 2012-05-01 ENCOUNTER — Encounter: Payer: Self-pay | Admitting: Pulmonary Disease

## 2012-05-01 ENCOUNTER — Ambulatory Visit (INDEPENDENT_AMBULATORY_CARE_PROVIDER_SITE_OTHER): Payer: Self-pay | Admitting: Pulmonary Disease

## 2012-05-01 VITALS — BP 130/72 | HR 72 | Temp 98.1°F | Ht 72.0 in | Wt 206.0 lb

## 2012-05-01 DIAGNOSIS — I10 Essential (primary) hypertension: Secondary | ICD-10-CM

## 2012-05-01 DIAGNOSIS — J45909 Unspecified asthma, uncomplicated: Secondary | ICD-10-CM

## 2012-05-01 NOTE — Assessment & Plan Note (Signed)
OK to stay off atenolol - rechk BP few times over next 2 weeks -if high - will use amlodipin instead

## 2012-05-01 NOTE — Patient Instructions (Addendum)
Doxycycline daily x 7days Stay on symbicort 160 2 puffs twice daily Use rescue inhaler albuterol 2 puffs as needed Try to lower dose of methadone Make sure you keep your next appt

## 2012-05-01 NOTE — Assessment & Plan Note (Addendum)
Doxycycline daily x 7days for acute bronchitis Stay on symbicort 160 2 puffs twice daily Use rescue inhaler albuterol 2 puffs as needed Try to lower dose of methadone Spirometry next visit Fact that he has not required prednisone in last 2 y makes me doubt ABPA, although he meets some criteria

## 2012-05-01 NOTE — Progress Notes (Signed)
  Subjective:    Patient ID: Roy Richmond, male    DOB: 06-25-1986, 26 y.o.   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 However, aspergillus ABs neg.  IgE 486, RAST - cat & dog dander extremely high, meadow grass +, Aspergillus Ab neg,    May 25, 2010   PFTs showed FEv1 76%, ratio nml, , borderline BD response in smaller airways  05/01/2012 2 y fu  c/o chest congestion, wheezing, cough w/ brown-green-yellow x 1 week.  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 Also denies presyncope, palpitations, heartburn, abdominal pain, nausea, vomiting, diarrhea or change in bowel or urinary habits, dysuria,hematuria, rash, arthralgias, visual complaints, headache, numbness weakness or ataxia.     Objective:   Physical Exam  Gen. Pleasant, well-nourished, in no distress, normal affect ENT - no lesions, no post nasal drip Neck: No JVD, no thyromegaly, no carotid bruits Lungs: no use of accessory muscles, no dullness to percussion, faint exp rhonchi  Cardiovascular: Rhythm regular, heart sounds  normal, no murmurs or gallops, no peripheral edema Abdomen: soft and non-tender, no  hepatosplenomegaly, BS normal. Musculoskeletal: No deformities, no cyanosis or clubbing Neuro:  alert, non focal       Assessment & Plan:

## 2012-05-02 ENCOUNTER — Emergency Department (HOSPITAL_COMMUNITY)
Admission: EM | Admit: 2012-05-02 | Discharge: 2012-05-02 | Disposition: A | Discharge status: Home / Self Care | Payer: Self-pay | Attending: Emergency Medicine | Admitting: Emergency Medicine

## 2012-05-02 ENCOUNTER — Encounter (HOSPITAL_COMMUNITY): Payer: Self-pay | Admitting: Emergency Medicine

## 2012-05-02 DIAGNOSIS — F17200 Nicotine dependence, unspecified, uncomplicated: Secondary | ICD-10-CM | POA: Insufficient documentation

## 2012-05-02 DIAGNOSIS — I10 Essential (primary) hypertension: Secondary | ICD-10-CM | POA: Insufficient documentation

## 2012-05-02 DIAGNOSIS — G8929 Other chronic pain: Secondary | ICD-10-CM | POA: Insufficient documentation

## 2012-05-02 DIAGNOSIS — F1920 Other psychoactive substance dependence, uncomplicated: Secondary | ICD-10-CM | POA: Insufficient documentation

## 2012-05-02 DIAGNOSIS — J45901 Unspecified asthma with (acute) exacerbation: Secondary | ICD-10-CM

## 2012-05-02 DIAGNOSIS — F1120 Opioid dependence, uncomplicated: Secondary | ICD-10-CM

## 2012-05-02 DIAGNOSIS — J9801 Acute bronchospasm: Secondary | ICD-10-CM | POA: Insufficient documentation

## 2012-05-02 MED ORDER — DOXYCYCLINE HYCLATE 100 MG PO CAPS: 100.0000 mg | ORAL_CAPSULE | Freq: Two times a day (BID) | ORAL | Status: DC

## 2012-05-02 MED ORDER — METHADONE HCL 10 MG PO TABS
100.0000 mg | ORAL_TABLET | Freq: Once | ORAL | Status: AC
  Administered 2012-05-02: 100 mg via ORAL
  Filled 2012-05-02: qty 10

## 2012-05-02 MED ORDER — PREDNISONE 20 MG PO TABS: ORAL_TABLET | ORAL | Status: DC

## 2012-05-02 NOTE — ED Notes (Signed)
Pt reports that he missed his appointment at the [facility] clinic today. Pt reports takes 105 mg of methadone a day. Pt had dose yesterday.

## 2012-05-02 NOTE — ED Provider Notes (Signed)
History   This chart was scribed for Roy Horn, MD by Melba Coon. The patient was seen in room TR07C/TR07C and the patient's care was started at 1:01PM.    CSN: [id number]  Arrival date & time 05/02/12  1034   None     Chief Complaint  Patient presents with  . Medication Management    (Consider location/radiation/quality/duration/timing/severity/associated sxs/prior treatment) The history is provided by the patient. No language interpreter was used.   Roy Richmond is a 26 y.o. male who presents to the Emergency Department complaining of missing his appointment at his methadone clinic today for his lower back pain and is requesting methadone here at the ED. He takes 105 mg/day and had yesterday's dose. However, he complains of inflexible hours and schedule by the clinic. Also c/o wheezing, cough, congestion, SOB c/w Hx of asthma and tried to refill meds that his PCP Rx which did not get sent to the pharmacy. Denies HA, fever, neck pain, sore throat, rash, back pain, CP, abd pain, n/v/d, dysuria, or extremity pain, edema, weakness, numbness, or tingling. No known allergies. No other pertinent medical symptoms.   Past Medical History  Diagnosis Date  . HTN (hypertension)   . Asthma     History reviewed. No pertinent past surgical history.  No family history on file.  History  Substance Use Topics  . Smoking status: Current Every Day Smoker -- 0.5 packs/day    Types: Cigarettes  . Smokeless tobacco: Not on file  . Alcohol Use: Yes     12 beers per week. Quit 04/18/2012      Review of Systems 10 Systems reviewed and all are negative for acute change except as noted in the HPI.   Allergies  Review of patient's allergies indicates no known allergies.  Home Medications   Current Outpatient Rx  Name Route Sig Dispense Refill  . BUDESONIDE-FORMOTEROL FUMARATE 160-4.5 MCG/ACT IN AERO Inhalation Inhale 2 puffs into the lungs 2 (two) times daily. 1 Inhaler 0   Please advise pt to keep appt for additional refil ...  . METHADONE HCL 10 MG/ML PO CONC Oral Take 105 mg by mouth daily.     Marland Kitchen DOXYCYCLINE HYCLATE 100 MG PO CAPS Oral Take 1 capsule (100 mg total) by mouth 2 (two) times daily. One po bid x 7 days 14 capsule 0  . PREDNISONE 20 MG PO TABS  3 tabs po day one, then 2 po daily x 4 days 11 tablet 0    BP 146/77  Pulse 81  Temp 98 F (36.7 C) (Oral)  Resp 16  SpO2 95%  Physical Exam  Nursing note and vitals reviewed. Constitutional:       Awake, alert, nontoxic appearance.  HENT:  Head: Atraumatic.  Eyes: Right eye exhibits no discharge. Left eye exhibits no discharge.  Neck: Neck supple.  Pulmonary/Chest: Effort normal. He has wheezes (mild wheezing and rhonchi). He exhibits no tenderness.  Abdominal: Soft. There is no tenderness. There is no rebound.  Musculoskeletal: He exhibits tenderness (chronic lower lumbar).       Baseline ROM, no obvious new focal weakness.  Neurological:       Mental status and motor strength appears baseline for patient and situation.  Skin: No rash noted.  Psychiatric: He has a normal mood and affect.    ED Course  Procedures (including critical care time)   COORDINATION OF CARE:  1:05PM - pt will be Rx prednisone, inhaler, and doxycycline. One time dose of  methadone will be ordered for the pt. Mr Purdom is ready for d/c.   Labs Reviewed - No data to display No results found.   1. Bronchospasm   2. Asthma attack   3. Chronic pain   4. Chronic narcotic dependence       MDM  Patient / Family / Caregiver informed of clinical course, understand medical decision-making process, and agree with plan. I personally performed the services described in this documentation, which was scribed in my presence. The recorded information has been reviewed and considered. I doubt any other EMC precluding discharge at this time including, but not necessarily limited to the following:SBI.        Roy Horn, MD 05/04/12 [phone]

## 2012-07-29 ENCOUNTER — Encounter (HOSPITAL_COMMUNITY): Payer: Self-pay | Admitting: *Deleted

## 2012-07-29 ENCOUNTER — Emergency Department (HOSPITAL_COMMUNITY)
Admission: EM | Admit: 2012-07-29 | Discharge: 2012-07-29 | Disposition: A | Discharge status: Home Health | Payer: Self-pay | Attending: Emergency Medicine | Admitting: Emergency Medicine

## 2012-07-29 DIAGNOSIS — Z79899 Other long term (current) drug therapy: Secondary | ICD-10-CM | POA: Insufficient documentation

## 2012-07-29 DIAGNOSIS — Z760 Encounter for issue of repeat prescription: Secondary | ICD-10-CM | POA: Insufficient documentation

## 2012-07-29 DIAGNOSIS — F1110 Opioid abuse, uncomplicated: Secondary | ICD-10-CM | POA: Insufficient documentation

## 2012-07-29 DIAGNOSIS — F1120 Opioid dependence, uncomplicated: Secondary | ICD-10-CM

## 2012-07-29 DIAGNOSIS — F17200 Nicotine dependence, unspecified, uncomplicated: Secondary | ICD-10-CM | POA: Insufficient documentation

## 2012-07-29 DIAGNOSIS — J45909 Unspecified asthma, uncomplicated: Secondary | ICD-10-CM | POA: Insufficient documentation

## 2012-07-29 DIAGNOSIS — I10 Essential (primary) hypertension: Secondary | ICD-10-CM | POA: Insufficient documentation

## 2012-07-29 MED ORDER — METHADONE HCL 10 MG/ML PO CONC: 50.0000 mg | Freq: Every day | ORAL | Status: DC

## 2012-07-29 MED ORDER — METHADONE HCL 10 MG PO TABS
50.0000 mg | ORAL_TABLET | Freq: Every day | ORAL | Status: DC
  Administered 2012-07-29: 50 mg via ORAL
  Filled 2012-07-29: qty 5

## 2012-07-29 NOTE — ED Notes (Signed)
To ED for eval after missing Methadone dose from [facility] tx center. Pt states he has come to ED prior when missing doses. Center is closed today

## 2012-07-29 NOTE — ED Provider Notes (Signed)
History     CSN: [id number]  Arrival date & time 07/29/12  0711   First MD Initiated Contact with Patient 07/29/12 0725      Chief Complaint  Patient presents with  . Medication Refill    (Consider location/radiation/quality/duration/timing/severity/associated sxs/prior treatment) HPI  27 year old male with history of narcotic abuse currently taking methadone presents to the ER requesting for methadone treatment. Patient reports he missed his methadone dose from [facility] this AM because clinic hr were shorter than usual.  Last dose with yesterday morning.  Pt reports he has steadily decreased methadone dose to 50mg /day.  Currently denies any specific complaints except feeling agitated, and jittery.  No fever, cp, sob, abd pain, n/v/d, or rash.    Past Medical History  Diagnosis Date  . HTN (hypertension)   . Asthma     History reviewed. No pertinent past surgical history.  History reviewed. No pertinent family history.  History  Substance Use Topics  . Smoking status: Current Every Day Smoker -- 0.5 packs/day    Types: Cigarettes  . Smokeless tobacco: Not on file  . Alcohol Use: Yes     Comment: 12 beers per week. Quit 04/18/2012      Review of Systems  Constitutional:       10 Systems reviewed and all are negative for acute change except as noted in the HPI.       Allergies  Review of patient's allergies indicates no known allergies.  Home Medications   Current Outpatient Rx  Name  Route  Sig  Dispense  Refill  . BUDESONIDE-FORMOTEROL FUMARATE 160-4.5 MCG/ACT IN AERO   Inhalation   Inhale 2 puffs into the lungs 2 (two) times daily.   1 Inhaler   0     Please advise pt to keep appt for additional refil ...   . METHADONE HCL 10 MG/ML PO CONC   Oral   Take 50 mg by mouth daily.            BP 149/96  Pulse 100  Temp 98.2 F (36.8 C)  Resp 16  SpO2 98%  Physical Exam  Nursing note and vitals reviewed. Constitutional: He is  oriented to person, place, and time. He appears well-developed and well-nourished.       Face is flushed, mildly diaphoretic.    HENT:  Head: Normocephalic and atraumatic.  Mouth/Throat: Oropharynx is clear and moist.  Eyes: Conjunctivae normal are normal.  Neck: Normal range of motion. Neck supple.  Cardiovascular: Normal rate and regular rhythm.   Pulmonary/Chest: Breath sounds normal. He has no wheezes. He exhibits no tenderness.  Abdominal: Soft. There is no tenderness.  Neurological: He is alert and oriented to person, place, and time.  Skin: Skin is warm. No rash noted.  Psychiatric: He has a normal mood and affect.    ED Course  Procedures (including critical care time)  Labs Reviewed - No data to display No results found.   No diagnosis found.  1. Medication refill, methadone  MDM  Pt currently attending methadone clinic but missing his appointment this AM.  He has been to ER several times in the past to receive methadone dose when he missed it. He has been making moderate improvement with decreasing methadone dose.  Last seen in ER in November he requires methadone 105mg , however his dose has decreased to 50mg  today.  I discussed with my attending, we agree to give pt 1 time dose.  We strongly encourage pt to  not miss future appointment as the ER is not the appropriate place for methadone treatment.  Pt voice understanding and agrees with plan.  No other concerning problem today.    BP 149/96  Pulse 100  Temp 98.2 F (36.8 C)  Resp 16  SpO2 98%       Fayrene Helper, PA-C 07/29/12 [phone]

## 2012-07-29 NOTE — ED Provider Notes (Signed)
Medical screening examination/treatment/procedure(s) were performed by non-physician practitioner and as supervising physician I was immediately available for consultation/collaboration.   Flint Melter, MD 07/29/12 [phone]

## 2012-08-03 ENCOUNTER — Emergency Department (HOSPITAL_COMMUNITY)
Admission: EM | Admit: 2012-08-03 | Discharge: 2012-08-03 | Disposition: A | Discharge status: Home / Self Care | Payer: Self-pay | Attending: Emergency Medicine | Admitting: Emergency Medicine

## 2012-08-03 ENCOUNTER — Encounter (HOSPITAL_COMMUNITY): Payer: Self-pay | Admitting: Cardiology

## 2012-08-03 DIAGNOSIS — I10 Essential (primary) hypertension: Secondary | ICD-10-CM | POA: Insufficient documentation

## 2012-08-03 DIAGNOSIS — J45909 Unspecified asthma, uncomplicated: Secondary | ICD-10-CM | POA: Insufficient documentation

## 2012-08-03 DIAGNOSIS — F17200 Nicotine dependence, unspecified, uncomplicated: Secondary | ICD-10-CM | POA: Insufficient documentation

## 2012-08-03 DIAGNOSIS — F1120 Opioid dependence, uncomplicated: Secondary | ICD-10-CM | POA: Insufficient documentation

## 2012-08-03 DIAGNOSIS — Z79899 Other long term (current) drug therapy: Secondary | ICD-10-CM | POA: Insufficient documentation

## 2012-08-03 MED ORDER — METHADONE HCL 10 MG PO TABS
50.0000 mg | ORAL_TABLET | Freq: Every day | ORAL | Status: DC
  Administered 2012-08-03: 50 mg via ORAL
  Filled 2012-08-03: qty 5

## 2012-08-03 NOTE — ED Notes (Signed)
Pt reports he missed his MD appt and is out of his Methadone at home. No other complaints.

## 2012-08-03 NOTE — ED Notes (Signed)
The pt reports that he is only waiting on methadone he does not need anything else

## 2012-08-03 NOTE — ED Notes (Signed)
Pt states he missed his methadone dose today had the stomach flu and was not able to get there

## 2012-08-03 NOTE — ED Provider Notes (Signed)
History   This chart was scribed for non-physician practitioner working with Laray Anger, DO by Gerlean Ren, ED Scribe. This patient was seen in room TR10C/TR10C and the patient's care was started at 3:11 PM.      CSN: [id number]  Arrival date & time 08/03/12  1021   First MD Initiated Contact with Patient 08/03/12 1507      Chief Complaint  Patient presents with  . out of meds     The history is provided by the patient. No language interpreter was used.   WYATT THORSTENSON is a 27 y.o. male with h/o HTN and asthma who presents to the Emergency Department because he is out of Methadone and states he was not able to fill it at scheduled MD appointment.  Pt has come here on several occasional different occasions for the same reason.  Pt attends methadone clinic daily and has had his dosage gradually reduced.  Pt denies abdominal pain, chest pain.  Pt states he becomes nauseated and vomits after every meal but denies any nausea or emesis when not eating.  Pt is a current everyday smoker and denies alcohol use.  Past Medical History  Diagnosis Date  . HTN (hypertension)   . Asthma     History reviewed. No pertinent past surgical history.  History reviewed. No pertinent family history.  History  Substance Use Topics  . Smoking status: Current Every Day Smoker -- 0.5 packs/day    Types: Cigarettes  . Smokeless tobacco: Not on file  . Alcohol Use: Yes     Comment: 12 beers per week. Quit 04/18/2012      Review of Systems  Cardiovascular: Negative for chest pain.  Gastrointestinal: Negative for abdominal pain.    Allergies  Review of patient's allergies indicates no known allergies.  Home Medications   Current Outpatient Rx  Name  Route  Sig  Dispense  Refill  . BUDESONIDE-FORMOTEROL FUMARATE 160-4.5 MCG/ACT IN AERO   Inhalation   Inhale 2 puffs into the lungs daily.         Marland Kitchen METHADONE HCL 10 MG/ML PO CONC   Oral   Take 50 mg by mouth daily.             BP 131/76  Pulse 92  Temp 98.1 F (36.7 C) (Oral)  Resp 18  SpO2 97%  Physical Exam  Nursing note and vitals reviewed. Constitutional: He is oriented to person, place, and time. He appears well-developed and well-nourished. No distress.  HENT:  Head: Normocephalic and atraumatic.  Eyes: Conjunctivae normal are normal.  Neck: Neck supple. No tracheal deviation present.  Cardiovascular: Normal rate.   Pulmonary/Chest: Effort normal. No respiratory distress.  Musculoskeletal: Normal range of motion.  Neurological: He is alert and oriented to person, place, and time.  Skin: Skin is warm and dry.  Psychiatric: He has a normal mood and affect. His behavior is normal.    ED Course  Procedures (including critical care time) DIAGNOSTIC STUDIES: Oxygen Saturation is 97% on room air, adequate by my interpretation.    COORDINATION OF CARE: 3:14 PM- Patient informed of clinical course, understands medical decision-making process, and agrees with plan.  Ordered PO Dolophine 50mg  at bedside.  Labs Reviewed - No data to display No results found.   No diagnosis found.  1. ER visit for methadone dose  MDM  Pt with mult. ER visits requesting for methadone dose, since he misses his dose.  Last dose yesterday.  I specifically warn  patient that he cannot continues to come to ER for dosing and must try to make it to his appointment.  Pt voice understanding.     BP 131/76  Pulse 92  Temp 98.1 F (36.7 C) (Oral)  Resp 18  SpO2 97%     I personally performed the services described in this documentation, which was scribed in my presence. The recorded information has been reviewed and is accurate.     Fayrene Helper, PA-C 08/03/12 1556

## 2012-08-05 NOTE — ED Provider Notes (Signed)
Medical screening examination/treatment/procedure(s) were performed by non-physician practitioner and as supervising physician I was immediately available for consultation/collaboration.   Abbygael Curtiss M Leyani Gargus, DO 08/05/12 1006 

## 2012-09-04 ENCOUNTER — Ambulatory Visit: Payer: Self-pay | Admitting: Adult Health

## 2013-02-26 ENCOUNTER — Telehealth: Payer: Self-pay | Admitting: Pulmonary Disease

## 2013-02-26 MED ORDER — BUDESONIDE-FORMOTEROL FUMARATE 160-4.5 MCG/ACT IN AERO: 2.0000 | INHALATION_SPRAY | Freq: Two times a day (BID) | RESPIRATORY_TRACT | Status: DC

## 2013-02-26 NOTE — Telephone Encounter (Signed)
Pt scheduled appt with TP. I left 1 sample of symbicort upfront for p/u. Nothing further was needed

## 2013-02-28 ENCOUNTER — Emergency Department (HOSPITAL_COMMUNITY)
Admission: EM | Admit: 2013-02-28 | Discharge: 2013-02-28 | Disposition: A | Discharge status: Home / Self Care | Payer: 59 | Attending: Emergency Medicine | Admitting: Emergency Medicine

## 2013-02-28 ENCOUNTER — Encounter (HOSPITAL_COMMUNITY): Payer: Self-pay | Admitting: *Deleted

## 2013-02-28 DIAGNOSIS — J45909 Unspecified asthma, uncomplicated: Secondary | ICD-10-CM | POA: Insufficient documentation

## 2013-02-28 DIAGNOSIS — F1120 Opioid dependence, uncomplicated: Secondary | ICD-10-CM | POA: Insufficient documentation

## 2013-02-28 DIAGNOSIS — I10 Essential (primary) hypertension: Secondary | ICD-10-CM | POA: Insufficient documentation

## 2013-02-28 DIAGNOSIS — Z79899 Other long term (current) drug therapy: Secondary | ICD-10-CM | POA: Insufficient documentation

## 2013-02-28 DIAGNOSIS — F17200 Nicotine dependence, unspecified, uncomplicated: Secondary | ICD-10-CM | POA: Insufficient documentation

## 2013-02-28 MED ORDER — METHADONE HCL 10 MG PO TABS
40.0000 mg | ORAL_TABLET | Freq: Once | ORAL | Status: AC
  Administered 2013-02-28: 40 mg via ORAL
  Filled 2013-02-28: qty 4

## 2013-02-28 NOTE — ED Provider Notes (Signed)
  CSN: [id number]     Arrival date & time 02/28/13  0803 History     First MD Initiated Contact with Patient 02/28/13 [phone]     Chief Complaint  Patient presents with  . Medication Refill   (Consider location/radiation/quality/duration/timing/severity/associated sxs/prior Treatment) HPI Patient presents to the emergency department for methadone.  Patient, states he normally goes to the methadone clinic, but was unable to get there the last 2 days due to working out of town.  Patient, states, that he will be transferring his methadone clinic to the area, where he is working at this time.  Patient denies nausea, vomiting, diarrhea, headache, blurred vision, weakness, numbness, dizziness, chest pain, shortness of breath, or abdominal pain.  Patient, states, that he hopes that Tuesday he can have all of this sorted out. Past Medical History  Diagnosis Date  . HTN (hypertension)   . Asthma    History reviewed. No pertinent past surgical history. No family history on file. History  Substance Use Topics  . Smoking status: Current Every Day Smoker -- 0.50 packs/day    Types: Cigarettes  . Smokeless tobacco: Not on file  . Alcohol Use: Yes     Comment: 12 beers per week. Quit 04/18/2012    Review of Systems All other systems negative except as documented in the HPI. All pertinent positives and negatives as reviewed in the HPI. Allergies  Review of patient's allergies indicates no known allergies.  Home Medications   Current Outpatient Rx  Name  Route  Sig  Dispense  Refill  . budesonide-formoterol (SYMBICORT) 160-4.5 MCG/ACT inhaler   Inhalation   Inhale 2 puffs into the lungs daily.         . methadone (DOLOPHINE) 10 MG/ML solution   Oral   Take 40 mg by mouth daily.           BP 128/88  Pulse 80  Temp(Src) 98.5 F (36.9 C) (Oral)  Resp 17  Ht 6' (1.829 m)  Wt 214 lb (97.07 kg)  BMI 29.02 kg/m2  SpO2 96% Physical Exam  Nursing note and vitals reviewed. Constitutional:  He appears well-developed and well-nourished. No distress.  HENT:  Head: Normocephalic and atraumatic.  Mouth/Throat: Oropharynx is clear and moist.  Eyes: Pupils are equal, round, and reactive to light.  Neck: Normal range of motion. Neck supple. No tracheal deviation present.  Cardiovascular: Normal rate, regular rhythm and normal heart sounds.   Pulmonary/Chest: Effort normal and breath sounds normal. No respiratory distress.  Skin: Skin is warm and dry. No rash noted.    ED Course   Procedures (including critical care time) Advised the patient that he will need to go to the methadone clinic and that the emergency department cannot be a regular place for this type of care. Patient voices an understanding of this.  I advised the patient to return here as needed  MDM    Carlyle Dolly, PA-C 02/28/13 [phone]

## 2013-02-28 NOTE — ED Notes (Addendum)
PT missed Methadone clinic on SAT. Pt is requesting his Methadone dose.

## 2013-02-28 NOTE — ED Provider Notes (Signed)
Medical screening examination/treatment/procedure(s) were performed by non-physician practitioner and as supervising physician I was immediately available for consultation/collaboration.   Glynn Octave, MD 02/28/13 1116

## 2013-03-03 ENCOUNTER — Encounter (HOSPITAL_COMMUNITY): Payer: Self-pay | Admitting: Emergency Medicine

## 2013-03-03 ENCOUNTER — Emergency Department (HOSPITAL_COMMUNITY)
Admission: EM | Admit: 2013-03-03 | Discharge: 2013-03-03 | Disposition: A | Discharge status: Home / Self Care | Payer: 59 | Attending: Emergency Medicine | Admitting: Emergency Medicine

## 2013-03-03 DIAGNOSIS — Y939 Activity, unspecified: Secondary | ICD-10-CM | POA: Insufficient documentation

## 2013-03-03 DIAGNOSIS — F17200 Nicotine dependence, unspecified, uncomplicated: Secondary | ICD-10-CM | POA: Insufficient documentation

## 2013-03-03 DIAGNOSIS — J45901 Unspecified asthma with (acute) exacerbation: Secondary | ICD-10-CM | POA: Insufficient documentation

## 2013-03-03 DIAGNOSIS — Y990 Civilian activity done for income or pay: Secondary | ICD-10-CM | POA: Insufficient documentation

## 2013-03-03 DIAGNOSIS — Z79899 Other long term (current) drug therapy: Secondary | ICD-10-CM | POA: Insufficient documentation

## 2013-03-03 DIAGNOSIS — Y929 Unspecified place or not applicable: Secondary | ICD-10-CM | POA: Insufficient documentation

## 2013-03-03 DIAGNOSIS — S0180XA Unspecified open wound of other part of head, initial encounter: Secondary | ICD-10-CM | POA: Insufficient documentation

## 2013-03-03 DIAGNOSIS — R062 Wheezing: Secondary | ICD-10-CM

## 2013-03-03 DIAGNOSIS — I10 Essential (primary) hypertension: Secondary | ICD-10-CM | POA: Insufficient documentation

## 2013-03-03 DIAGNOSIS — S0181XA Laceration without foreign body of other part of head, initial encounter: Secondary | ICD-10-CM

## 2013-03-03 DIAGNOSIS — W268XXA Contact with other sharp object(s), not elsewhere classified, initial encounter: Secondary | ICD-10-CM | POA: Insufficient documentation

## 2013-03-03 MED ORDER — PREDNISONE (PAK) 10 MG PO TABS: 10.0000 mg | ORAL_TABLET | Freq: Every day | ORAL | Status: DC

## 2013-03-03 MED ORDER — ALBUTEROL SULFATE HFA 108 (90 BASE) MCG/ACT IN AERS: 2.0000 | INHALATION_SPRAY | Freq: Once | RESPIRATORY_TRACT | Status: DC

## 2013-03-03 MED ORDER — CEPHALEXIN 500 MG PO CAPS: 500.0000 mg | ORAL_CAPSULE | Freq: Two times a day (BID) | ORAL | Status: DC

## 2013-03-03 MED ORDER — ALBUTEROL SULFATE HFA 108 (90 BASE) MCG/ACT IN AERS
2.0000 | INHALATION_SPRAY | Freq: Once | RESPIRATORY_TRACT | Status: AC
  Administered 2013-03-03: 2 via RESPIRATORY_TRACT
  Filled 2013-03-03: qty 6.7

## 2013-03-03 MED ORDER — TRAMADOL HCL 50 MG PO TABS: 50.0000 mg | ORAL_TABLET | Freq: Four times a day (QID) | ORAL | Status: DC | PRN

## 2013-03-03 NOTE — ED Notes (Signed)
Pt c/o approx 2 inch laceration to under chin happened yesterday with piece of pipe; bleeding controlled

## 2013-03-03 NOTE — ED Provider Notes (Signed)
  Medical screening examination/treatment/procedure(s) were performed by non-physician practitioner and as supervising physician I was immediately available for consultation/collaboration.    Cayci Mcnabb, MD 03/03/13 1558 

## 2013-03-03 NOTE — ED Notes (Signed)
Pt did not answer x 2 

## 2013-03-03 NOTE — ED Provider Notes (Signed)
CSN: [id number]     Arrival date & time 03/03/13  0957 History     First MD Initiated Contact with Patient 03/03/13 1018     Chief Complaint  Patient presents with  . Facial Laceration   (Consider location/radiation/quality/duration/timing/severity/associated sxs/prior Treatment) HPI Comments: Patient reports he was working yesterday when a piece of metal popped up and hit him in the chin, cutting his chin.  He superglued it shut but it was not holding well so he took the glue off an hour ago. Tenderness around wound.  Denies fevers, chills, vomiting, malocclusion, bony pain.  States the metal did not break apart and could not have left a FB.  The history is provided by the patient.    Past Medical History  Diagnosis Date  . HTN (hypertension)   . Asthma    History reviewed. No pertinent past surgical history. History reviewed. No pertinent family history. History  Substance Use Topics  . Smoking status: Current Every Day Smoker -- 0.50 packs/day    Types: Cigarettes  . Smokeless tobacco: Not on file  . Alcohol Use: Yes     Comment: 12 beers per week. Quit 04/18/2012    Review of Systems  Constitutional: Negative for fever and chills.  HENT: Negative for dental problem.   Gastrointestinal: Negative for vomiting.  Neurological: Negative for weakness and numbness.    Allergies  Review of patient's allergies indicates no known allergies.  Home Medications   Current Outpatient Rx  Name  Route  Sig  Dispense  Refill  . budesonide-formoterol (SYMBICORT) 160-4.5 MCG/ACT inhaler   Inhalation   Inhale 2 puffs into the lungs daily.         . methadone (DOLOPHINE) 10 MG/ML solution   Oral   Take 40 mg by mouth daily.           BP 146/100  Pulse 83  Temp(Src) 97.1 F (36.2 C) (Oral)  Resp 18  SpO2 93% Physical Exam  Nursing note and vitals reviewed. Constitutional: He appears well-developed and well-nourished. No distress.  HENT:  Head: Normocephalic.     Neck: Neck supple.  Pulmonary/Chest: Effort normal.  Neurological: He is alert.  Skin: He is not diaphoretic.    ED Course   Procedures (including critical care time)  Labs Reviewed - No data to display No results found.  LACERATION REPAIR Performed by: Trixie Dredge B Authorized by: Trixie Dredge B Consent: Verbal consent obtained. Risks and benefits: risks, benefits and alternatives were discussed Consent given by: patient Patient identity confirmed: provided demographic data Prepped and Draped in normal sterile fashion Wound explored  Laceration Location: submental  Laceration Length: 5cm  No Foreign Bodies seen or palpated  Anesthesia: local infiltration  Local anesthetic: lidocaine 2% no epinephrine  Anesthetic total: 5 ml  Irrigation method: syringe Amount of cleaning: standard, thorough  Skin closure: 5-0 ethilon  Number of sutures: 6  Technique: simple interrupted  Patient tolerance: Patient tolerated the procedure well with no immediate complications.  Filed Vitals:   03/03/13 1021  BP: 146/100  Pulse: 83  Temp: 97.1 F (36.2 C)  Resp: 18    Filed Vitals:   03/03/13 1125  BP: 154/90  Pulse: 82  Temp:   Resp: 18     1. Facial laceration, initial encounter   2. Wheezing     MDM  Pt with laceration yesterday 3pm.  As laceration is on face, less than 24 hours, will suture. Tetanus is within 4 years.   Pt  also mentioned wheezing and cough since he cleared land with poison ivy last week.  Wheezing on exam.  Pt is comfortable appearing, no increased work of breathing, denies SOB. Given albuterol inhaler and prednisone.  He has scheduled follow up with his pulmonologist soon.    Discussed  findings, treatment, follow up  with patient.  Pt given return precautions.  Pt verbalizes understanding and agrees with plan.       Trixie Dredge, PA-C 03/03/13 1530

## 2013-03-03 NOTE — ED Notes (Signed)
Pt did not answer x 1 

## 2013-03-03 NOTE — ED Notes (Signed)
[place], PA at bedside for suturing.

## 2013-03-06 ENCOUNTER — Encounter (HOSPITAL_COMMUNITY): Payer: Self-pay | Admitting: *Deleted

## 2013-03-06 ENCOUNTER — Emergency Department (HOSPITAL_COMMUNITY)
Admission: EM | Admit: 2013-03-06 | Discharge: 2013-03-06 | Disposition: A | Discharge status: Home / Self Care | Payer: 59 | Attending: Emergency Medicine | Admitting: Emergency Medicine

## 2013-03-06 DIAGNOSIS — Z79899 Other long term (current) drug therapy: Secondary | ICD-10-CM | POA: Insufficient documentation

## 2013-03-06 DIAGNOSIS — F1120 Opioid dependence, uncomplicated: Secondary | ICD-10-CM

## 2013-03-06 DIAGNOSIS — J45909 Unspecified asthma, uncomplicated: Secondary | ICD-10-CM | POA: Insufficient documentation

## 2013-03-06 DIAGNOSIS — F17200 Nicotine dependence, unspecified, uncomplicated: Secondary | ICD-10-CM | POA: Insufficient documentation

## 2013-03-06 DIAGNOSIS — R112 Nausea with vomiting, unspecified: Secondary | ICD-10-CM | POA: Insufficient documentation

## 2013-03-06 DIAGNOSIS — I10 Essential (primary) hypertension: Secondary | ICD-10-CM | POA: Insufficient documentation

## 2013-03-06 DIAGNOSIS — R109 Unspecified abdominal pain: Secondary | ICD-10-CM | POA: Insufficient documentation

## 2013-03-06 DIAGNOSIS — Z760 Encounter for issue of repeat prescription: Secondary | ICD-10-CM | POA: Insufficient documentation

## 2013-03-06 DIAGNOSIS — IMO0002 Reserved for concepts with insufficient information to code with codable children: Secondary | ICD-10-CM | POA: Insufficient documentation

## 2013-03-06 MED ORDER — METHADONE HCL 10 MG PO TABS
40.0000 mg | ORAL_TABLET | Freq: Once | ORAL | Status: AC
  Administered 2013-03-06: 40 mg via ORAL
  Filled 2013-03-06: qty 4

## 2013-03-06 NOTE — ED Notes (Signed)
Pt states, "I normally go to the [facility] everyday. I got there 10 mins too late today. I always had back pain. I took pain pills & went to the Methadone clinic to try to get off of them. I need them to keep from getting sick. I get nauseous, throw up, can't sleep & it messes my stomach up really bad." pt c/o nausea & diarrhea x 3 today, last time Methadone administered yesterday morning, pt takes 40 mg Methadone daily, A&O x4, follows commands, speaks in complete sentences

## 2013-03-06 NOTE — ED Provider Notes (Signed)
CSN: [id number]     Arrival date & time 03/06/13  1191 History     First MD Initiated Contact with Patient 03/06/13 [phone]     Chief Complaint  Patient presents with  . Medication Refill   (Consider location/radiation/quality/duration/timing/severity/associated sxs/prior Treatment) HPI Comments: Patient is a 27 year old male with a history of substance abuse, currently taking methadone, who presents for methadone. Patient states that he goes to a methadone clinic, but arrived late today and was not able to get his daily dose. Patient requesting methadone today stating that he usually takes 40 mg daily. Patient states that he does not get his methadone he becomes symptomatic with nausea, vomiting, and abdominal pain. Patient has been seen in ED for same in the past. Denies any other pain or medical complaints.  The history is provided by the patient. No language interpreter was used.    Past Medical History  Diagnosis Date  . HTN (hypertension)   . Asthma    No past surgical history on file. No family history on file. History  Substance Use Topics  . Smoking status: Current Every Day Smoker -- 0.50 packs/day    Types: Cigarettes  . Smokeless tobacco: Not on file  . Alcohol Use: Yes     Comment: 12 beers per week. Quit 04/18/2012    Review of Systems  Constitutional: Negative for fever.  Respiratory: Negative for shortness of breath.   Cardiovascular: Negative for chest pain.  Gastrointestinal: Negative for nausea and vomiting.  Neurological: Negative for weakness and numbness.  All other systems reviewed and are negative.   Allergies  Review of patient's allergies indicates no known allergies.  Home Medications   Current Outpatient Rx  Name  Route  Sig  Dispense  Refill  . budesonide-formoterol (SYMBICORT) 160-4.5 MCG/ACT inhaler   Inhalation   Inhale 2 puffs into the lungs 2 (two) times daily.          . methadone (DOLOPHINE) 10 MG/ML solution   Oral   Take 40 mg  by mouth daily.          . cephALEXin (KEFLEX) 500 MG capsule   Oral   Take 1 capsule (500 mg total) by mouth 2 (two) times daily.   10 capsule   0   . predniSONE (STERAPRED UNI-PAK) 10 MG tablet   Oral   Take 1 tablet (10 mg total) by mouth daily. Day 1: take 6 tabs.  Day 2: 5 tabs  Day 3: 4 tabs  Day 4: 3 tabs  Day 5: 2 tabs  Day 6: 1 tab   21 tablet   0   . traMADol (ULTRAM) 50 MG tablet   Oral   Take 1 tablet (50 mg total) by mouth every 6 (six) hours as needed for pain.   10 tablet   0    BP 142/82  Pulse 105  Temp(Src) 98 F (36.7 C) (Oral)  Resp 20  SpO2 98%  Physical Exam  Nursing note and vitals reviewed. Constitutional: He is oriented to person, place, and time. He appears well-developed and well-nourished. No distress.  HENT:  Head: Normocephalic and atraumatic.  Eyes: Conjunctivae and EOM are normal. No scleral icterus.  Neck: Normal range of motion.  Cardiovascular: Normal rate, regular rhythm and normal heart sounds.   Pulmonary/Chest: Effort normal. No respiratory distress.  Musculoskeletal: Normal range of motion.  Neurological: He is alert and oriented to person, place, and time.  Skin: Skin is warm and dry. No rash noted.  He is not diaphoretic. No erythema. No pallor.  Psychiatric: He has a normal mood and affect. His behavior is normal.   ED Course   Procedures (including critical care time)  Labs Reviewed - No data to display No results found.  1. Methadone use    MDM  Patient presents to ED for daily methadone dose as he arrived late to his methadone clinic today. Have reviewed patient's past ED visits, most of which have been for this same purpose; patient has been given daily dose of methadone at all prior visits. Patient has been told in past that ED should not be used as a fallback to obtain methadone should he miss his daily appointments with the clinic; this was stressed again at today's visit. Patient verbalizes understanding. 40mg   methadone given. Patient hemodynamically stable and appropriate for d/c with f/u with his methadone clinic tomorrow.    Antony Madura, PA-C 03/07/13 1727

## 2013-03-06 NOTE — ED Notes (Signed)
Called pharmacy re: methadone availability
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
## 2013-03-08 NOTE — ED Provider Notes (Signed)
Medical screening examination/treatment/procedure(s) were performed by non-physician practitioner and as supervising physician I was immediately available for consultation/collaboration.  Flint Melter, MD 03/08/13 1020

## 2013-03-12 ENCOUNTER — Ambulatory Visit: Payer: Self-pay | Admitting: Adult Health

## 2013-03-15 ENCOUNTER — Emergency Department (HOSPITAL_COMMUNITY)
Admission: EM | Admit: 2013-03-15 | Discharge: 2013-03-15 | Disposition: A | Discharge status: Home / Self Care | Payer: 59 | Attending: Emergency Medicine | Admitting: Emergency Medicine

## 2013-03-15 ENCOUNTER — Encounter (HOSPITAL_COMMUNITY): Payer: Self-pay | Admitting: Emergency Medicine

## 2013-03-15 DIAGNOSIS — I10 Essential (primary) hypertension: Secondary | ICD-10-CM | POA: Insufficient documentation

## 2013-03-15 DIAGNOSIS — Z4802 Encounter for removal of sutures: Secondary | ICD-10-CM | POA: Insufficient documentation

## 2013-03-15 DIAGNOSIS — F17200 Nicotine dependence, unspecified, uncomplicated: Secondary | ICD-10-CM | POA: Insufficient documentation

## 2013-03-15 DIAGNOSIS — Z79899 Other long term (current) drug therapy: Secondary | ICD-10-CM | POA: Insufficient documentation

## 2013-03-15 DIAGNOSIS — J45909 Unspecified asthma, uncomplicated: Secondary | ICD-10-CM | POA: Insufficient documentation

## 2013-03-15 NOTE — ED Provider Notes (Signed)
CSN: [id number]     Arrival date & time 03/15/13  4098 History     First MD Initiated Contact with Patient 03/15/13 [phone]     Chief Complaint  Patient presents with  . Suture / Staple Removal   (Consider location/radiation/quality/duration/timing/severity/associated sxs/prior Treatment) HPI Comments: Patient presents emergency department with chief complaint of suture removal. Patient sustained a laceration to his chin approximately 10 days ago. The laceration was repaired with 6 stitches. The laceration appears to be well-healing. There is no evidence of infection. Patient denies fevers, chills, nausea, or vomiting.  The history is provided by the patient. No language interpreter was used.    Past Medical History  Diagnosis Date  . HTN (hypertension)   . Asthma    History reviewed. No pertinent past surgical history. No family history on file. History  Substance Use Topics  . Smoking status: Current Every Day Smoker -- 0.50 packs/day    Types: Cigarettes  . Smokeless tobacco: Not on file  . Alcohol Use: Yes     Comment: 12 beers per week. Quit 04/18/2012    Review of Systems  All other systems reviewed and are negative.    Allergies  Review of patient's allergies indicates no known allergies.  Home Medications   Current Outpatient Rx  Name  Route  Sig  Dispense  Refill  . budesonide-formoterol (SYMBICORT) 160-4.5 MCG/ACT inhaler   Inhalation   Inhale 2 puffs into the lungs 2 (two) times daily.          . methadone (DOLOPHINE) 10 MG/ML solution   Oral   Take 40 mg by mouth daily.          . predniSONE (STERAPRED UNI-PAK) 10 MG tablet   Oral   Take 1 tablet (10 mg total) by mouth daily. Day 1: take 6 tabs.  Day 2: 5 tabs  Day 3: 4 tabs  Day 4: 3 tabs  Day 5: 2 tabs  Day 6: 1 tab   21 tablet   0    BP 144/94  Pulse 98  Temp(Src) 97.1 F (36.2 C) (Oral)  SpO2 96% Physical Exam  Nursing note and vitals reviewed. Constitutional: He is oriented to person,  place, and time. He appears well-developed and well-nourished.  HENT:  Head: Normocephalic and atraumatic.  Eyes: Conjunctivae and EOM are normal.  Neck: Normal range of motion.  Cardiovascular: Normal rate.   Pulmonary/Chest: Effort normal.  Abdominal: He exhibits no distension.  Musculoskeletal: Normal range of motion.  Neurological: He is alert and oriented to person, place, and time.  Skin: Skin is dry.  6 sutures to the chin, no evidence of infection, wound is well-healing  Psychiatric: He has a normal mood and affect. His behavior is normal. Judgment and thought content normal.    ED Course   Procedures (including critical care time) SUTURE REMOVAL Performed by: Roxy Horseman  Consent: Verbal consent obtained. Consent given by: patient Required items: required blood products, implants, devices, and special equipment available Time out: Immediately prior to procedure a "time out" was called to verify the correct patient, procedure, equipment, support staff and site/side marked as required.  Location: Chin  Wound Appearance: clean  Sutures/Staples Removed: 6   Patient tolerance: Patient tolerated the procedure well with no immediate complications.    Labs Reviewed - No data to display No results found. 1. Visit for suture removal     MDM  Suture removal   Pt to ER for staple/suture removal and wound check  as above. Procedure tolerated well. Vitals normal, no signs of infection. Scar minimization & return precautions given at dc.    Roxy Horseman, PA-C 03/15/13 [phone]

## 2013-03-15 NOTE — ED Notes (Signed)
Here for suture removal. Sutures intact to chin.

## 2013-03-16 NOTE — ED Provider Notes (Signed)
Medical screening examination/treatment/procedure(s) were performed by non-physician practitioner and as supervising physician I was immediately available for consultation/collaboration.  Nykayla Marcelli R. Jabri Blancett, MD 03/16/13 1531 

## 2013-03-17 ENCOUNTER — Encounter (HOSPITAL_COMMUNITY): Payer: Self-pay | Admitting: Neurology

## 2013-03-17 ENCOUNTER — Emergency Department (HOSPITAL_COMMUNITY)
Admission: EM | Admit: 2013-03-17 | Discharge: 2013-03-17 | Disposition: A | Discharge status: Home / Self Care | Payer: 59 | Attending: Emergency Medicine | Admitting: Emergency Medicine

## 2013-03-17 DIAGNOSIS — I10 Essential (primary) hypertension: Secondary | ICD-10-CM | POA: Insufficient documentation

## 2013-03-17 DIAGNOSIS — F17200 Nicotine dependence, unspecified, uncomplicated: Secondary | ICD-10-CM | POA: Insufficient documentation

## 2013-03-17 DIAGNOSIS — Z79899 Other long term (current) drug therapy: Secondary | ICD-10-CM | POA: Insufficient documentation

## 2013-03-17 DIAGNOSIS — IMO0002 Reserved for concepts with insufficient information to code with codable children: Secondary | ICD-10-CM | POA: Insufficient documentation

## 2013-03-17 DIAGNOSIS — J45901 Unspecified asthma with (acute) exacerbation: Secondary | ICD-10-CM | POA: Insufficient documentation

## 2013-03-17 MED ORDER — ALBUTEROL SULFATE (5 MG/ML) 0.5% IN NEBU
INHALATION_SOLUTION | RESPIRATORY_TRACT | Status: AC
  Administered 2013-03-17: 2.5 mg via RESPIRATORY_TRACT
  Filled 2013-03-17: qty 1

## 2013-03-17 MED ORDER — ALBUTEROL SULFATE HFA 108 (90 BASE) MCG/ACT IN AERS
1.0000 | INHALATION_SPRAY | Freq: Once | RESPIRATORY_TRACT | Status: AC
  Administered 2013-03-17: 2 via RESPIRATORY_TRACT
  Filled 2013-03-17 (×2): qty 6.7

## 2013-03-17 MED ORDER — IPRATROPIUM BROMIDE 0.02 % IN SOLN
RESPIRATORY_TRACT | Status: AC
  Administered 2013-03-17: 0.5 mg via RESPIRATORY_TRACT
  Filled 2013-03-17: qty 2.5

## 2013-03-17 MED ORDER — IPRATROPIUM BROMIDE 0.02 % IN SOLN
0.5000 mg | Freq: Once | RESPIRATORY_TRACT | Status: AC
  Filled 2013-03-17: qty 2.5

## 2013-03-17 MED ORDER — PREDNISONE 20 MG PO TABS
60.0000 mg | ORAL_TABLET | Freq: Once | ORAL | Status: AC
  Administered 2013-03-17: 60 mg via ORAL
  Filled 2013-03-17: qty 3

## 2013-03-17 MED ORDER — ALBUTEROL SULFATE (5 MG/ML) 0.5% IN NEBU
2.5000 mg | INHALATION_SOLUTION | Freq: Once | RESPIRATORY_TRACT | Status: AC
  Filled 2013-03-17: qty 0.5

## 2013-03-17 MED ORDER — ALBUTEROL SULFATE (5 MG/ML) 0.5% IN NEBU
2.5000 mg | INHALATION_SOLUTION | Freq: Once | RESPIRATORY_TRACT | Status: AC
  Administered 2013-03-17: 2.5 mg via RESPIRATORY_TRACT

## 2013-03-17 NOTE — Progress Notes (Signed)
Instructed patient with MDI and spacer. Had never used a spacer before. RN had already pulled med, but I instructed. RT will continue to monitor.

## 2013-03-17 NOTE — ED Provider Notes (Signed)
CSN: [id number]     Arrival date & time 03/17/13  0750 History     First MD Initiated Contact with Patient 03/17/13 [phone]     Chief Complaint  Patient presents with  . Wheezing   (Consider location/radiation/quality/duration/timing/severity/associated sxs/prior Treatment) HPI Comments: Patient is a 27 year old male with a past medical history of asthma and hypertension who presents with a 2 day history of wheezing. Symptoms started gradually when patient was working outside. Patient has not been able to alleviate his moderate symptoms. Patient takes Symbicort inhaler daily and has run out of his albuterol rescue inhaler. No aggravating/alleviating factors. No other associated symptoms.   Patient is a 27 y.o. male presenting with wheezing.  Wheezing   Past Medical History  Diagnosis Date  . HTN (hypertension)   . Asthma    History reviewed. No pertinent past surgical history. No family history on file. History  Substance Use Topics  . Smoking status: Current Every Day Smoker -- 0.50 packs/day    Types: Cigarettes  . Smokeless tobacco: Not on file  . Alcohol Use: Yes     Comment: 12 beers per week. Quit 04/18/2012    Review of Systems  Respiratory: Positive for wheezing.   All other systems reviewed and are negative.    Allergies  Review of patient's allergies indicates no known allergies.  Home Medications   Current Outpatient Rx  Name  Route  Sig  Dispense  Refill  . budesonide-formoterol (SYMBICORT) 160-4.5 MCG/ACT inhaler   Inhalation   Inhale 2 puffs into the lungs 2 (two) times daily.          . methadone (DOLOPHINE) 10 MG/ML solution   Oral   Take 40 mg by mouth daily.          . predniSONE (STERAPRED UNI-PAK) 10 MG tablet   Oral   Take 1 tablet (10 mg total) by mouth daily. Day 1: take 6 tabs.  Day 2: 5 tabs  Day 3: 4 tabs  Day 4: 3 tabs  Day 5: 2 tabs  Day 6: 1 tab   21 tablet   0    BP 165/108  Pulse 96  Temp(Src) 97.6 F (36.4 C) (Oral)   Resp 18  Ht 6' (1.829 m)  Wt 217 lb (98.431 kg)  BMI 29.42 kg/m2  SpO2 95% Physical Exam  Nursing note and vitals reviewed. Constitutional: He is oriented to person, place, and time. He appears well-developed and well-nourished. No distress.  HENT:  Head: Normocephalic and atraumatic.  Eyes: Conjunctivae are normal.  Neck: Normal range of motion.  Cardiovascular: Normal rate and regular rhythm.  Exam reveals no gallop and no friction rub.   No murmur heard. Pulmonary/Chest: Effort normal. He has wheezes. He has no rales. He exhibits no tenderness.  Wheezing noted throughout lung fields bilaterally.   Abdominal: Soft. He exhibits no distension. There is no tenderness. There is no rebound and no guarding.  Musculoskeletal: Normal range of motion.  Neurological: He is alert and oriented to person, place, and time. Coordination normal.  Speech is goal-oriented. Moves limbs without ataxia.   Skin: Skin is warm and dry.  Psychiatric: He has a normal mood and affect. His behavior is normal.    ED Course   Procedures (including critical care time)  Labs Reviewed - No data to display No results found. 1. Asthma exacerbation     MDM  8:30 AM Patient given albuterol nebulizer treatment with nebulized atrovent. Patient feeling better but  still having some wheezing. Patient will have another nebulizer treatment and PO prednisone. Vital stable and patient afebrile.   9:15 AM Patient is feeling better and has improved lung sounds. Vitals stable and patient afebrile. Patient will be discharged without further evaluation.   Emilia Beck, [place] 03/17/13 [phone]

## 2013-03-17 NOTE — ED Provider Notes (Signed)
Medical screening examination/treatment/procedure(s) were performed by non-physician practitioner and as supervising physician I was immediately available for consultation/collaboration.   Dwana Garin, MD 03/17/13 1527 

## 2013-03-17 NOTE — ED Notes (Signed)
Pt reporting wheezing x 2 days, hx asthma. Inhaler has ran out.

## 2013-03-19 ENCOUNTER — Ambulatory Visit (INDEPENDENT_AMBULATORY_CARE_PROVIDER_SITE_OTHER): Payer: 59 | Admitting: Adult Health

## 2013-03-19 ENCOUNTER — Encounter: Payer: Self-pay | Admitting: Adult Health

## 2013-03-19 ENCOUNTER — Ambulatory Visit (INDEPENDENT_AMBULATORY_CARE_PROVIDER_SITE_OTHER)
Admission: RE | Admit: 2013-03-19 | Discharge: 2013-03-19 | Disposition: A | Discharge status: Home / Self Care | Payer: 59 | Source: Ambulatory Visit | Attending: Adult Health | Admitting: Adult Health

## 2013-03-19 ENCOUNTER — Encounter: Payer: Self-pay | Admitting: *Deleted

## 2013-03-19 VITALS — BP 140/86 | HR 104 | Temp 97.7°F | Ht 72.0 in | Wt 218.6 lb

## 2013-03-19 DIAGNOSIS — J45909 Unspecified asthma, uncomplicated: Secondary | ICD-10-CM

## 2013-03-19 MED ORDER — PREDNISONE 10 MG PO TABS: ORAL_TABLET | ORAL | Status: DC

## 2013-03-19 MED ORDER — LEVALBUTEROL HCL 0.63 MG/3ML IN NEBU: 0.6300 mg | INHALATION_SOLUTION | Freq: Once | RESPIRATORY_TRACT | Status: DC

## 2013-03-19 NOTE — Assessment & Plan Note (Signed)
Slow to resolve asthma flare in active smoker   Plan  Most important goal is to quit smoking. Finish Keflex as directed. Prednisone taper. The next week. Use Symbicort 2 puffs twice daily, brush rinse and gargle after use I will call with chest x-ray results. Follow with Dr. Vassie Loll in 6 weeks and as needed Please contact office for sooner follow up if symptoms do not improve or worsen or seek emergency care

## 2013-03-19 NOTE — Patient Instructions (Addendum)
Most important goal is to quit smoking. Finish Keflex as directed. Prednisone taper. The next week. Use Symbicort 2 puffs twice daily, brush rinse and gargle after use I will call with chest x-ray results. Follow with Dr. Vassie Loll in 6 weeks and as needed Please contact office for sooner follow up if symptoms do not improve or worsen or seek emergency care

## 2013-03-19 NOTE — Progress Notes (Signed)
  Subjective:    Patient ID: Roy Richmond, male    DOB: 1985/12/19, 27 y.o.   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 However, aspergillus ABs neg.  IgE 486, RAST - cat & dog dander extremely high, meadow grass +, Aspergillus Ab neg,    May 25, 2010   PFTs showed FEv1 76%, ratio nml, , borderline BD response in smaller airways  05/01/12  2 y fu  c/o chest congestion, wheezing, cough w/ brown-green-yellow x 1 week.  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 .  given neb tx and keflex.  cough with yellow sputum, sweats.   Stil smoking . Still having cough, congestion and wheezing  On first day of Keflex.  Was seen 3 weeks ago , tx w/ steroid taper.  No cxr done.  Remains on symbicort , depends on samples.  Patient denies any hemoptysis, orthopnea, PND, or leg swelling.   Review of Systems  neg for any significant sore throat, dysphagia, itching, sneezing, nasal congestion or excess/ purulent secretions, fever, chills, sweats, unintended wt loss, pleuritic or exertional cp, hempoptysis, orthopnea pnd or change in chronic leg swelling. Also denies presyncope, palpitations, heartburn, abdominal pain, nausea, vomiting, diarrhea or change in bowel or urinary habits, dysuria,hematuria, rash, arthralgias, visual complaints, headache, numbness weakness or  ataxia.     Objective:   Physical Exam   Gen. Pleasant, well-nourished, in no distress, normal affect ENT - no lesions, no post nasal drip Neck: No JVD, no thyromegaly, no carotid bruits Lungs: no use of accessory muscles, no dullness to percussion, exp wheezing  Cardiovascular: Rhythm regular, heart sounds  normal, no murmurs or gallops, no peripheral edema Abdomen: soft and non-tender, no hepatosplenomegaly, BS normal. Musculoskeletal: No deformities, no cyanosis or clubbing Neuro:  alert, non focal       Assessment & Plan:

## 2013-03-19 NOTE — Addendum Note (Signed)
Addended by: Marcellus Scott on: 03/19/2013 04:43 PM   Modules accepted: Orders

## 2013-03-20 ENCOUNTER — Encounter (HOSPITAL_COMMUNITY): Payer: Self-pay | Admitting: Emergency Medicine

## 2013-03-20 ENCOUNTER — Emergency Department (HOSPITAL_COMMUNITY)
Admission: EM | Admit: 2013-03-20 | Discharge: 2013-03-20 | Disposition: A | Discharge status: Home / Self Care | Payer: 59 | Attending: Emergency Medicine | Admitting: Emergency Medicine

## 2013-03-20 DIAGNOSIS — IMO0002 Reserved for concepts with insufficient information to code with codable children: Secondary | ICD-10-CM | POA: Insufficient documentation

## 2013-03-20 DIAGNOSIS — F1120 Opioid dependence, uncomplicated: Secondary | ICD-10-CM

## 2013-03-20 DIAGNOSIS — F17200 Nicotine dependence, unspecified, uncomplicated: Secondary | ICD-10-CM | POA: Insufficient documentation

## 2013-03-20 DIAGNOSIS — Z760 Encounter for issue of repeat prescription: Secondary | ICD-10-CM | POA: Insufficient documentation

## 2013-03-20 DIAGNOSIS — Z79899 Other long term (current) drug therapy: Secondary | ICD-10-CM | POA: Insufficient documentation

## 2013-03-20 DIAGNOSIS — Z87828 Personal history of other (healed) physical injury and trauma: Secondary | ICD-10-CM | POA: Insufficient documentation

## 2013-03-20 DIAGNOSIS — Z792 Long term (current) use of antibiotics: Secondary | ICD-10-CM | POA: Insufficient documentation

## 2013-03-20 MED ORDER — METHADONE HCL 10 MG PO TABS
50.0000 mg | ORAL_TABLET | Freq: Once | ORAL | Status: AC
  Administered 2013-03-20: 50 mg via ORAL
  Filled 2013-03-20: qty 5

## 2013-03-20 NOTE — ED Provider Notes (Signed)
Medical screening examination/treatment/procedure(s) were performed by non-physician practitioner and as supervising physician I was immediately available for consultation/collaboration.   Dagmar Hait, MD 03/20/13 [phone]

## 2013-03-20 NOTE — ED Notes (Signed)
Pt reports that he missed his appointment for the methadone clinic.
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
## 2013-03-25 NOTE — Progress Notes (Signed)
Quick Note:  Patient returned call. Advised of cxr results / recs as stated by TP. Pt verbalized understanding and denied any questions. ______ 

## 2013-03-25 NOTE — Progress Notes (Signed)
Quick Note:  LMOM TCB x1. ______ 

## 2013-04-10 ENCOUNTER — Emergency Department (HOSPITAL_COMMUNITY)
Admission: EM | Admit: 2013-04-10 | Discharge: 2013-04-10 | Disposition: A | Discharge status: Home / Self Care | Payer: 59 | Attending: Emergency Medicine | Admitting: Emergency Medicine

## 2013-04-10 ENCOUNTER — Encounter (HOSPITAL_COMMUNITY): Payer: Self-pay | Admitting: Emergency Medicine

## 2013-04-10 DIAGNOSIS — J45909 Unspecified asthma, uncomplicated: Secondary | ICD-10-CM | POA: Insufficient documentation

## 2013-04-10 DIAGNOSIS — F1120 Opioid dependence, uncomplicated: Secondary | ICD-10-CM

## 2013-04-10 DIAGNOSIS — Z760 Encounter for issue of repeat prescription: Secondary | ICD-10-CM | POA: Insufficient documentation

## 2013-04-10 DIAGNOSIS — F17200 Nicotine dependence, unspecified, uncomplicated: Secondary | ICD-10-CM | POA: Insufficient documentation

## 2013-04-10 DIAGNOSIS — Z79899 Other long term (current) drug therapy: Secondary | ICD-10-CM | POA: Insufficient documentation

## 2013-04-10 DIAGNOSIS — I10 Essential (primary) hypertension: Secondary | ICD-10-CM | POA: Insufficient documentation

## 2013-04-10 MED ORDER — METHADONE HCL 10 MG PO TABS
50.0000 mg | ORAL_TABLET | Freq: Once | ORAL | Status: AC
  Administered 2013-04-10: 50 mg via ORAL
  Filled 2013-04-10: qty 5

## 2013-04-10 NOTE — ED Provider Notes (Signed)
CSN: [id number]     Arrival date & time 04/10/13  1258 History   First MD Initiated Contact with Patient 04/10/13 1320     Chief Complaint  Patient presents with  . Medication Refill   (Consider location/radiation/quality/duration/timing/severity/associated sxs/prior Treatment) The history is provided by the patient. No language interpreter was used.  Roy Richmond is a 27 year old male with past medical history of hypertension and asthma presenting to emergency department requesting methadone dosage. Patient reported that Roy Richmond was called into work last night due to a pipe rupturing that Roy Richmond needed to fix. Patient reported that Roy Richmond had an appointment this morning to get the methadone, reported that Roy Richmond was unable to make the appointment secondary to being at work, reported that the methadone clinic closes at 8 AM on Saturdays. Patient reported that Roy Richmond was unable to make it and has missed his methadone dosage. Patient reported that when Roy Richmond misses his methadone dosage, the methadone clinic refers to the emergency department to get the medication. Patient denied pain. Denied shortness of breath, chest pain, difficulty breathing, numbness, weakness, fatigue, abdominal pain, nausea, vomiting, diarrhea. PCP none  Past Medical History  Diagnosis Date  . HTN (hypertension)   . Asthma    History reviewed. No pertinent past surgical history. No family history on file. History  Substance Use Topics  . Smoking status: Current Every Day Smoker -- 0.50 packs/day    Types: Cigarettes  . Smokeless tobacco: Not on file  . Alcohol Use: Yes     Comment: once a month (2014)    Review of Systems  Constitutional: Negative for fever and chills.  HENT: Negative for sore throat and trouble swallowing.   Respiratory: Negative for chest tightness and shortness of breath.   Cardiovascular: Negative for chest pain.  Gastrointestinal: Negative for nausea, vomiting and abdominal pain.  Musculoskeletal: Negative  for back pain.  Neurological: Negative for dizziness, weakness and numbness.  All other systems reviewed and are negative.    Allergies  Review of patient's allergies indicates no known allergies.  Home Medications   Current Outpatient Rx  Name  Route  Sig  Dispense  Refill  . budesonide-formoterol (SYMBICORT) 160-4.5 MCG/ACT inhaler   Inhalation   Inhale 2 puffs into the lungs 2 (two) times daily.          . methadone (DOLOPHINE) 10 MG/ML solution   Oral   Take 50 mg by mouth daily.           BP 127/80  Pulse 78  Temp(Src) 98.3 F (36.8 C) (Oral)  Resp 16  Ht 6' (1.829 m)  Wt 217 lb (98.431 kg)  BMI 29.42 kg/m2  SpO2 99% Physical Exam  Nursing note and vitals reviewed. Constitutional: Roy Richmond is oriented to person, place, and time. Roy Richmond appears well-developed and well-nourished. No distress.  HENT:  Head: Normocephalic and atraumatic.  Eyes: Conjunctivae and EOM are normal. Pupils are equal, round, and reactive to light. Right eye exhibits no discharge. Left eye exhibits no discharge.  Neck: Normal range of motion. Neck supple.  Negative neck stiffness Negative nuchal rigidity Negative pain upon palpation to cervical spine  Cardiovascular: Normal rate, regular rhythm and normal heart sounds.  Exam reveals no friction rub.   No murmur heard. Pulses:      Radial pulses are 2+ on the right side, and 2+ on the left side.       Dorsalis pedis pulses are 2+ on the right side, and 2+ on the left side.  Pulmonary/Chest: Effort normal and breath sounds normal. No respiratory distress. Roy Richmond has no wheezes. Roy Richmond has no rales.  Musculoskeletal: Normal range of motion.  Negative pain upon palpation to mid spinal region of cervical, thoracic, lumbar spine  Lymphadenopathy:    Roy Richmond has no cervical adenopathy.  Neurological: Roy Richmond is alert and oriented to person, place, and time. No cranial nerve deficit. Roy Richmond exhibits normal muscle tone. Coordination normal.  Cranial nerves II through XII  grossly intact Strength 5+/5+ to upper and lower extremities bilaterally with resistance, equal distribution identified Gait proper and steady, negative sway or limp identified  Skin: Skin is warm and dry. No rash noted. Roy Richmond is not diaphoretic. No erythema.  Psychiatric: Roy Richmond has a normal mood and affect. His behavior is normal. Thought content normal.    ED Course  Procedures (including critical care time)  This provider reviewed patient's chart, patient was seen on 03/20/2013 regarding similar presentation-patient reported that Roy Richmond missed his methadone clinic appointment and had to come to the emergency department for methadone dose to be given.  Labs Review Labs Reviewed - No data to display Imaging Review No results found.  MDM   1. Methadone use    Patient presenting to emergency department requesting methadone due to missing his appointment with the methadone clinic this morning secondary to his job. Alert and oriented. Lungs clear to auscultation bilaterally. Heart rate and rhythm normal. Negative pain upon palpation to the mid spinal region of cervical, thoracic, lumbar region. Full range of motion to upper lower extremities bilaterally. Strength intact. Cranial nerves grossly intact. Negative neurological deficits identified. Patient's gait is proper, steady without any limp or sway identified. Patient given methadone 50 mg in ED setting. Discussed with patient that it is important and critical that Roy Richmond needs to get to all of his appointments at the methadone clinic. Discussed with patient that the emergency department is not for medication refills. Discussed with patient to continue to monitor symptoms and if symptoms are to worsen or change to report back to emergency department-strict return instructions given. Patient agreed to plan of care, understood, all questions answered.    Raymon Mutton, PA-C 04/11/13 1547

## 2013-04-10 NOTE — ED Notes (Signed)
Pt reports unable to go to methadone clinic today due to being out of town for a job. Pt went yesterday and takes 50 mg of methadone a day.

## 2013-04-11 NOTE — ED Provider Notes (Signed)
Medical screening examination/treatment/procedure(s) were performed by non-physician practitioner and as supervising physician I was immediately available for consultation/collaboration.  Raeford Razor, MD 04/11/13 1600

## 2013-04-30 ENCOUNTER — Ambulatory Visit: Payer: 59 | Admitting: Pulmonary Disease

## 2013-04-30 ENCOUNTER — Telehealth: Payer: Self-pay | Admitting: Pulmonary Disease

## 2013-04-30 MED ORDER — ALBUTEROL SULFATE HFA 108 (90 BASE) MCG/ACT IN AERS: 2.0000 | INHALATION_SPRAY | Freq: Four times a day (QID) | RESPIRATORY_TRACT | Status: DC | PRN

## 2013-04-30 NOTE — Telephone Encounter (Signed)
I called and offered pt appt and stated he can not come in the days and times offered. He stated he needed a 4:15 or 4:30. Pt also stated he would like samples as well. We do not have any symbicort but will leave albuterol upfront for pick up. Nothing further needed

## 2013-06-05 ENCOUNTER — Encounter (HOSPITAL_COMMUNITY): Payer: Self-pay | Admitting: Emergency Medicine

## 2013-06-05 ENCOUNTER — Emergency Department (HOSPITAL_COMMUNITY)
Admission: EM | Admit: 2013-06-05 | Discharge: 2013-06-05 | Disposition: A | Discharge status: Home / Self Care | Payer: 59 | Attending: Emergency Medicine | Admitting: Emergency Medicine

## 2013-06-05 DIAGNOSIS — F1120 Opioid dependence, uncomplicated: Secondary | ICD-10-CM | POA: Insufficient documentation

## 2013-06-05 DIAGNOSIS — F17200 Nicotine dependence, unspecified, uncomplicated: Secondary | ICD-10-CM | POA: Insufficient documentation

## 2013-06-05 DIAGNOSIS — J45909 Unspecified asthma, uncomplicated: Secondary | ICD-10-CM | POA: Insufficient documentation

## 2013-06-05 DIAGNOSIS — I10 Essential (primary) hypertension: Secondary | ICD-10-CM | POA: Insufficient documentation

## 2013-06-05 DIAGNOSIS — Z79899 Other long term (current) drug therapy: Secondary | ICD-10-CM | POA: Insufficient documentation

## 2013-06-05 MED ORDER — METHADONE HCL 10 MG PO TABS
50.0000 mg | ORAL_TABLET | Freq: Once | ORAL | Status: AC
  Administered 2013-06-05: 50 mg via ORAL
  Filled 2013-06-05: qty 5

## 2013-06-05 NOTE — ED Notes (Signed)
Pt states Methadone dosage is 80 mg not 50 mg.  Per PA, since no other previous documentation states 80 mg, 50 mg can be given today.  Pt ok with that dosage.

## 2013-06-05 NOTE — ED Notes (Signed)
Pt here for methadone.  Is on methadone for prescription drug addiction.

## 2013-06-05 NOTE — ED Provider Notes (Signed)
Medical screening examination/treatment/procedure(s) were performed by non-physician practitioner and as supervising physician I was immediately available for consultation/collaboration.  Corderius Saraceni L Janet Humphreys, MD 06/05/13 1627 
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
## 2013-06-17 ENCOUNTER — Telehealth: Payer: Self-pay | Admitting: Pulmonary Disease

## 2013-06-17 NOTE — Telephone Encounter (Signed)
Pt called and requested sample of the symbicort 160.  Pt has a pending appt with RA on 11/24.  i have called pt and he is aware.

## 2013-06-21 ENCOUNTER — Ambulatory Visit: Payer: 59 | Admitting: Pulmonary Disease

## 2013-06-26 ENCOUNTER — Encounter (HOSPITAL_COMMUNITY): Payer: Self-pay | Admitting: Emergency Medicine

## 2013-06-26 ENCOUNTER — Emergency Department (HOSPITAL_COMMUNITY)
Admission: EM | Admit: 2013-06-26 | Discharge: 2013-06-26 | Disposition: A | Discharge status: Home / Self Care | Payer: 59 | Attending: Emergency Medicine | Admitting: Emergency Medicine

## 2013-06-26 DIAGNOSIS — I10 Essential (primary) hypertension: Secondary | ICD-10-CM | POA: Insufficient documentation

## 2013-06-26 DIAGNOSIS — Z87828 Personal history of other (healed) physical injury and trauma: Secondary | ICD-10-CM | POA: Insufficient documentation

## 2013-06-26 DIAGNOSIS — G8929 Other chronic pain: Secondary | ICD-10-CM | POA: Insufficient documentation

## 2013-06-26 DIAGNOSIS — M549 Dorsalgia, unspecified: Secondary | ICD-10-CM | POA: Insufficient documentation

## 2013-06-26 DIAGNOSIS — F17200 Nicotine dependence, unspecified, uncomplicated: Secondary | ICD-10-CM | POA: Insufficient documentation

## 2013-06-26 DIAGNOSIS — Z79899 Other long term (current) drug therapy: Secondary | ICD-10-CM | POA: Insufficient documentation

## 2013-06-26 DIAGNOSIS — Z760 Encounter for issue of repeat prescription: Secondary | ICD-10-CM | POA: Insufficient documentation

## 2013-06-26 DIAGNOSIS — J45909 Unspecified asthma, uncomplicated: Secondary | ICD-10-CM | POA: Insufficient documentation

## 2013-06-26 MED ORDER — METHADONE HCL 10 MG PO TABS
50.0000 mg | ORAL_TABLET | Freq: Once | ORAL | Status: AC
  Administered 2013-06-26: 50 mg via ORAL

## 2013-06-26 MED ORDER — METHADONE HCL 10 MG PO TABS: 59.0000 mg | ORAL_TABLET | Freq: Once | ORAL | Status: DC

## 2013-06-26 NOTE — ED Notes (Signed)
PA at bedside.
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
## 2013-06-26 NOTE — ED Notes (Signed)
Needs his methodone

## 2013-06-30 NOTE — ED Provider Notes (Signed)
Medical screening examination/treatment/procedure(s) were performed by non-physician practitioner and as supervising physician I was immediately available for consultation/collaboration.  EKG Interpretation   None        Kemarion Abbey, MD 06/30/13 0822 

## 2013-07-26 ENCOUNTER — Telehealth: Payer: Self-pay | Admitting: Pulmonary Disease

## 2013-07-26 MED ORDER — BUDESONIDE-FORMOTEROL FUMARATE 160-4.5 MCG/ACT IN AERO: 2.0000 | INHALATION_SPRAY | Freq: Two times a day (BID) | RESPIRATORY_TRACT | Status: AC

## 2013-07-26 NOTE — Telephone Encounter (Signed)
Sample of symbicort given to patient. Too costly for pt at this time. No albuterol available Pt has upcomming appt with Roy Richmond 07/2013

## 2013-08-16 ENCOUNTER — Telehealth: Payer: Self-pay | Admitting: Pulmonary Disease

## 2013-08-16 ENCOUNTER — Encounter: Payer: Self-pay | Admitting: *Deleted

## 2013-08-16 MED ORDER — ALBUTEROL SULFATE HFA 108 (90 BASE) MCG/ACT IN AERS: 2.0000 | INHALATION_SPRAY | Freq: Four times a day (QID) | RESPIRATORY_TRACT | Status: AC | PRN

## 2013-08-16 NOTE — Telephone Encounter (Signed)
Spoke with the pt and advised we can leave symbicort sample but no rescue albuterol available  I have sent rx to his pharm for ventolin  Nothing further needed

## 2013-08-24 ENCOUNTER — Ambulatory Visit (INDEPENDENT_AMBULATORY_CARE_PROVIDER_SITE_OTHER)
Admission: RE | Admit: 2013-08-24 | Discharge: 2013-08-24 | Disposition: A | Discharge status: Home / Self Care | Payer: Self-pay | Source: Ambulatory Visit | Attending: Pulmonary Disease | Admitting: Pulmonary Disease

## 2013-08-24 ENCOUNTER — Encounter: Payer: Self-pay | Admitting: Pulmonary Disease

## 2013-08-24 ENCOUNTER — Ambulatory Visit (INDEPENDENT_AMBULATORY_CARE_PROVIDER_SITE_OTHER): Payer: 59 | Admitting: Pulmonary Disease

## 2013-08-24 ENCOUNTER — Encounter: Payer: Self-pay | Admitting: *Deleted

## 2013-08-24 VITALS — BP 138/80 | HR 62 | Temp 98.1°F | Wt 219.4 lb

## 2013-08-24 DIAGNOSIS — J45909 Unspecified asthma, uncomplicated: Secondary | ICD-10-CM

## 2013-08-24 MED ORDER — ALBUTEROL SULFATE (2.5 MG/3ML) 0.083% IN NEBU
2.5000 mg | INHALATION_SOLUTION | Freq: Once | RESPIRATORY_TRACT | Status: AC
  Administered 2013-08-24: 2.5 mg via RESPIRATORY_TRACT

## 2013-08-24 MED ORDER — METHYLPREDNISOLONE ACETATE 80 MG/ML IJ SUSP
80.0000 mg | Freq: Once | INTRAMUSCULAR | Status: AC
  Administered 2013-08-24: 80 mg via INTRAMUSCULAR

## 2013-08-24 MED ORDER — DOXYCYCLINE HYCLATE 100 MG PO TABS: 100.0000 mg | ORAL_TABLET | Freq: Every day | ORAL | Status: DC

## 2013-08-24 MED ORDER — PREDNISONE 10 MG PO TABS: ORAL_TABLET | ORAL | Status: DC

## 2013-08-24 NOTE — Patient Instructions (Signed)
depomedrol 80 mg x 1  Albuterol neb x 1 Prednisone 10 mg  -Take 4 tabs  daily with food x 4 days, then 3 tabs daily x 4 days, then 2 tabs daily x 4 days, then 1 tab daily x4 days then stop. #40 Doxycycline 100 mg x 7days Stay on symbicort  Use albuterol MDi for rescue

## 2013-08-24 NOTE — Progress Notes (Signed)
   Subjective:    Patient ID: Roy NimrodMatthew R Reta, male    DOB: 1986-01-06, 28 y.o.   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 However, aspergillus ABs neg.  IgE 486, RAST - cat & dog dander extremely high, meadow grass +, Aspergillus Ab neg,  PFTs 04/2010 showed FEv1 76%, ratio nml, , borderline BD response in smaller airways      08/24/2013  Chief Complaint  Patient presents with  . Follow-up    Pt c/o SOB, nasal and chest congestion, prod cough w/ green phlem. Several times he has had specks of blood in his mucus. He also blows out green phlem. Pt reports he had flu about 1 month ago. He reports he has not work in 2 days.    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  Review of Systems neg for any significant sore throat, dysphagia, itching, sneezing, nasal congestion or excess/ purulent secretions, fever, chills, sweats, unintended wt loss, pleuritic or exertional cp, hempoptysis, orthopnea pnd or change in chronic leg swelling. Also denies presyncope, palpitations, heartburn, abdominal pain, nausea, vomiting, diarrhea or change in bowel or urinary habits, dysuria,hematuria, rash, arthralgias, visual complaints, headache, numbness weakness or ataxia.     Objective:   Physical Exam  Gen. Pleasant, obese, in no distress, normal affect ENT - no lesions, no post nasal drip, class 2-3 airway Neck: No JVD, no thyromegaly, no carotid bruits Lungs: no use of accessory muscles, no dullness to percussion, decreased with faint BL exp rhonchi  Cardiovascular: Rhythm  regular, heart sounds  normal, no murmurs or gallops, no peripheral edema Abdomen: soft and non-tender, no hepatosplenomegaly, BS normal. Musculoskeletal: No deformities, no cyanosis or clubbing Neuro:  alert, non focal, no tremors       Assessment & Plan:

## 2013-08-30 NOTE — Assessment & Plan Note (Signed)
depomedrol 80 mg x 1  Albuterol neb x 1 Prednisone 10 mg  -Take 4 tabs  daily with food x 4 days, then 3 tabs daily x 4 days, then 2 tabs daily x 4 days, then 1 tab daily x4 days then stop. #40 Doxycycline 100 mg x 7days Stay on symbicort  Use albuterol MDi for rescue   

## 2013-09-14 ENCOUNTER — Ambulatory Visit: Payer: Self-pay | Admitting: Pulmonary Disease

## 2020-07-06 ENCOUNTER — Emergency Department (HOSPITAL_COMMUNITY): Payer: 59

## 2020-07-06 ENCOUNTER — Encounter (HOSPITAL_COMMUNITY): Payer: Self-pay

## 2020-07-06 ENCOUNTER — Inpatient Hospital Stay (HOSPITAL_COMMUNITY)
Admission: EM | Admit: 2020-07-06 | Discharge: 2020-07-10 | DRG: 871 | Disposition: A | Discharge status: Home / Self Care | Payer: 59 | Attending: Internal Medicine | Admitting: Internal Medicine

## 2020-07-06 DIAGNOSIS — Z20822 Contact with and (suspected) exposure to covid-19: Secondary | ICD-10-CM | POA: Diagnosis present

## 2020-07-06 DIAGNOSIS — F17210 Nicotine dependence, cigarettes, uncomplicated: Secondary | ICD-10-CM | POA: Diagnosis present

## 2020-07-06 DIAGNOSIS — J189 Pneumonia, unspecified organism: Secondary | ICD-10-CM | POA: Diagnosis present

## 2020-07-06 DIAGNOSIS — G4733 Obstructive sleep apnea (adult) (pediatric): Secondary | ICD-10-CM | POA: Diagnosis present

## 2020-07-06 DIAGNOSIS — A419 Sepsis, unspecified organism: Principal | ICD-10-CM | POA: Diagnosis present

## 2020-07-06 DIAGNOSIS — I10 Essential (primary) hypertension: Secondary | ICD-10-CM | POA: Diagnosis present

## 2020-07-06 DIAGNOSIS — Z79899 Other long term (current) drug therapy: Secondary | ICD-10-CM

## 2020-07-06 DIAGNOSIS — J9601 Acute respiratory failure with hypoxia: Secondary | ICD-10-CM | POA: Diagnosis present

## 2020-07-06 DIAGNOSIS — J45909 Unspecified asthma, uncomplicated: Secondary | ICD-10-CM | POA: Diagnosis present

## 2020-07-06 DIAGNOSIS — G8929 Other chronic pain: Secondary | ICD-10-CM | POA: Diagnosis present

## 2020-07-06 DIAGNOSIS — R6520 Severe sepsis without septic shock: Secondary | ICD-10-CM | POA: Diagnosis present

## 2020-07-06 DIAGNOSIS — Z7951 Long term (current) use of inhaled steroids: Secondary | ICD-10-CM

## 2020-07-06 LAB — CBC WITH DIFFERENTIAL/PLATELET
Abs Immature Granulocytes: 0.17 10*3/uL — ABNORMAL HIGH (ref 0.00–0.07)
Basophils Absolute: 0.1 10*3/uL (ref 0.0–0.1)
Basophils Relative: 0 %
Eosinophils Absolute: 0.1 10*3/uL (ref 0.0–0.5)
Eosinophils Relative: 0 %
HCT: 48.6 % (ref 39.0–52.0)
Hemoglobin: 16.5 g/dL (ref 13.0–17.0)
Immature Granulocytes: 1 %
Lymphocytes Relative: 4 %
Lymphs Abs: 0.8 10*3/uL (ref 0.7–4.0)
MCH: 31.7 pg (ref 26.0–34.0)
MCHC: 34 g/dL (ref 30.0–36.0)
MCV: 93.3 fL (ref 80.0–100.0)
Monocytes Absolute: 1.3 10*3/uL — ABNORMAL HIGH (ref 0.1–1.0)
Monocytes Relative: 7 %
Neutro Abs: 17.4 10*3/uL — ABNORMAL HIGH (ref 1.7–7.7)
Neutrophils Relative %: 88 %
Platelets: 225 10*3/uL (ref 150–400)
RBC: 5.21 MIL/uL (ref 4.22–5.81)
RDW: 12 % (ref 11.5–15.5)
WBC: 19.8 10*3/uL — ABNORMAL HIGH (ref 4.0–10.5)
nRBC: 0 % (ref 0.0–0.2)

## 2020-07-06 LAB — D-DIMER, QUANTITATIVE: D-Dimer, Quant: 0.52 ug/mL-FEU — ABNORMAL HIGH (ref 0.00–0.50)

## 2020-07-06 LAB — BASIC METABOLIC PANEL
Anion gap: 10 (ref 5–15)
BUN: 12 mg/dL (ref 6–20)
CO2: 26 mmol/L (ref 22–32)
Calcium: 8.7 mg/dL — ABNORMAL LOW (ref 8.9–10.3)
Chloride: 98 mmol/L (ref 98–111)
Creatinine, Ser: 0.74 mg/dL (ref 0.61–1.24)
GFR, Estimated: >60 mL/min (ref >60)
Glucose, Bld: 119 mg/dL — ABNORMAL HIGH (ref 70–99)
Potassium: 4.1 mmol/L (ref 3.5–5.1)
Sodium: 134 mmol/L — ABNORMAL LOW (ref 135–145)

## 2020-07-06 MED ORDER — SODIUM CHLORIDE 0.9 % IV BOLUS
1000.0000 mL | Freq: Once | INTRAVENOUS | Status: AC
  Administered 2020-07-06: 1000 mL via INTRAVENOUS

## 2020-07-06 MED ORDER — ALBUTEROL SULFATE HFA 108 (90 BASE) MCG/ACT IN AERS
6.0000 | INHALATION_SPRAY | RESPIRATORY_TRACT | Status: AC
  Administered 2020-07-06 – 2020-07-07 (×3): 6 via RESPIRATORY_TRACT
  Filled 2020-07-06: qty 6.7

## 2020-07-06 MED ORDER — MAGNESIUM SULFATE 2 GM/50ML IV SOLN
2.0000 g | INTRAVENOUS | Status: AC
  Administered 2020-07-06: 2 g via INTRAVENOUS
  Filled 2020-07-06: qty 50

## 2020-07-06 MED ORDER — ACETAMINOPHEN 325 MG PO TABS: 650.0000 mg | ORAL_TABLET | Freq: Once | ORAL | Status: DC

## 2020-07-06 MED ORDER — DEXAMETHASONE SODIUM PHOSPHATE 10 MG/ML IJ SOLN
10.0000 mg | Freq: Once | INTRAMUSCULAR | Status: AC
  Administered 2020-07-06: 10 mg via INTRAVENOUS
  Filled 2020-07-06: qty 1

## 2020-07-06 MED ORDER — IPRATROPIUM BROMIDE HFA 17 MCG/ACT IN AERS
2.0000 | INHALATION_SPRAY | RESPIRATORY_TRACT | Status: AC
  Administered 2020-07-06 – 2020-07-07 (×3): 2 via RESPIRATORY_TRACT
  Filled 2020-07-06: qty 12.9

## 2020-07-06 NOTE — ED Triage Notes (Signed)
Patient BIB EMS from home with c/o sob on exertion with productive cough of green sputum for 5 days, inhaler was not helping, 88% on RA.

## 2020-07-06 NOTE — ED Notes (Signed)
Patient transported to X-ray 

## 2020-07-06 NOTE — ED Notes (Signed)
ED Provider at bedside. 

## 2020-07-06 NOTE — ED Provider Notes (Signed)
[facility]-EMERGENCY DEPT Provider Note   CSN: [id number] Arrival date & time: 07/06/20  2233     History Chief Complaint  Patient presents with  . Respiratory Distress    Roy Richmond is a 34 y.o. male.   34 year old male with a history of hypertension and asthma presents to the emergency department for evaluation of shortness of breath.  He has been experiencing cold symptoms over the past 3 to 4 days including a cough productive of green sputum, congestion.  Has noted some chills, but has not taken his temperature at home.  Did have a low-grade fever in triage of 100.5 F.  Received 1 g Tylenol by EMS in transport.  Began to feel more short of breath today which was aggravated by exertion, unrelieved with his home inhalers.  Did use a portable pulse oximetry machine at home which was reading in the 70s on room air.  He has not had any syncope, hemoptysis, vomiting, diarrhea, abdominal pain.  Does report some waxing and waning lower extremity swelling, worse on the right, over the past few days.  No recent surgeries or hospitalizations.  No prior history of VTE.  Reports that family members tested positive for COVID a few weeks ago.  He is unvaccinated.       Past Medical History:  Diagnosis Date  . Asthma   . HTN (hypertension)     Patient Active Problem List   Diagnosis Date Noted  . GENERALIZED ANXIETY DISORDER 05/25/2010  . HYPERTENSION 02/12/2010  . OBSTRUCTIVE SLEEP APNEA 02/12/2010  . ASTHMA 09/04/2009    History reviewed. No pertinent surgical history.     History reviewed. No pertinent family history.  Social History   Tobacco Use  . Smoking status: Current Every Day Smoker    Packs/day: 0.02    Years: 10.00    Pack years: 0.20    Types: Cigarettes  Substance Use Topics  . Alcohol use: Yes    Comment: once a month (2014)  . Drug use: No    Home Medications Prior to Admission medications   Medication Sig Start Date End  Date Taking? Authorizing Provider  albuterol (PROVENTIL HFA;VENTOLIN HFA) 108 (90 BASE) MCG/ACT inhaler Inhale 2 puffs into the lungs every 6 (six) hours as needed for wheezing. 08/16/13   Oretha Milch, MD  budesonide-formoterol (SYMBICORT) 160-4.5 MCG/ACT inhaler Inhale 2 puffs into the lungs 2 (two) times daily. 07/26/13   Oretha Milch, MD  doxycycline (VIBRA-TABS) 100 MG tablet Take 1 tablet (100 mg total) by mouth daily. 08/24/13   Oretha Milch, MD  methadone (DOLOPHINE) 10 MG/ML solution Take 80 mg by mouth daily.     [provider]  predniSONE (DELTASONE) 10 MG tablet Take 4 po qd X4 days, then 3 po qd X4 days, then 2 po qd X4 days, then 1 po qd X4 days 08/24/13   Oretha Milch, MD    Allergies    Patient has no known allergies.  Review of Systems   Review of Systems  Ten systems reviewed and are negative for acute change, except as noted in the HPI.    Physical Exam Updated Vital Signs BP 130/75   Pulse 97   Temp 99.2 F (37.3 C) (Oral)   Resp 18   Ht 6' (1.829 m)   Wt 111.6 kg   SpO2 95%   BMI 33.36 kg/m   Physical Exam Vitals and nursing note reviewed.  Constitutional:  General: He is not in acute distress.    Appearance: He is well-developed and well-nourished. He is not diaphoretic.     Comments: Patient in no acute distress  HENT:     Head: Normocephalic and atraumatic.  Eyes:     General: No scleral icterus.    Extraocular Movements: EOM normal.     Conjunctiva/sclera: Conjunctivae normal.  Cardiovascular:     Rate and Rhythm: Regular rhythm. Tachycardia present.     Pulses: Normal pulses.     Comments: Tachycardia to the 120s Pulmonary:     Effort: Pulmonary effort is normal. No respiratory distress.     Comments: Diffuse expiratory wheezing with rales in bilateral bases.  No respiratory distress.  Speaking in full sentences without accessory muscle use.  Satting 94% on 4 L via nasal cannula. Musculoskeletal:        General: Normal  range of motion.     Cervical back: Normal range of motion.     Comments: No lower extremity pitting edema  Skin:    General: Skin is warm and dry.     Coloration: Skin is not pale.     Findings: No erythema or rash.  Neurological:     Mental Status: He is alert and oriented to person, place, and time.     Coordination: Coordination normal.  Psychiatric:        Mood and Affect: Mood and affect normal.        Behavior: Behavior normal.     ED Results / Procedures / Treatments   Labs (all labs ordered are listed, but only abnormal results are displayed) Labs Reviewed  CBC WITH DIFFERENTIAL/PLATELET - Abnormal; Notable for the following components:      Result Value   WBC 19.8 (*)    Neutro Abs 17.4 (*)    Monocytes Absolute 1.3 (*)    Abs Immature Granulocytes 0.17 (*)    All other components within normal limits  BASIC METABOLIC PANEL - Abnormal; Notable for the following components:   Sodium 134 (*)    Glucose, Bld 119 (*)    Calcium 8.7 (*)    All other components within normal limits  D-DIMER, QUANTITATIVE (NOT AT [facility]) - Abnormal; Notable for the following components:   D-Dimer, Quant 0.52 (*)    All other components within normal limits  RESP PANEL BY RT-PCR (FLU A&B, COVID) ARPGX2  CULTURE, BLOOD (ROUTINE X 2)  CULTURE, BLOOD (ROUTINE X 2)  LACTIC ACID, PLASMA    EKG None  Radiology DG Chest 2 View  Result Date: 07/06/2020 CLINICAL DATA:  Hypoxia cough EXAM: CHEST - 2 VIEW COMPARISON:  08/24/2013 FINDINGS: Patchy perihilar and basilar airspace opacities. No pleural effusion. Normal heart size. No pneumothorax IMPRESSION: Patchy perihilar and basilar airspace opacities suspicious for multifocal pneumonia, possible atypical or viral pneumonia. Electronically Signed   By: Jasmine Pang M.D.   On: 07/06/2020 23:19   CT Angio Chest PE W and/or Wo Contrast  Result Date: 07/07/2020 CLINICAL DATA:  Positive D-dimer shortness of breath EXAM: CT ANGIOGRAPHY CHEST WITH  CONTRAST TECHNIQUE: Multidetector CT imaging of the chest was performed using the standard protocol during bolus administration of intravenous contrast. Multiplanar CT image reconstructions and MIPs were obtained to evaluate the vascular anatomy. CONTRAST:  OMNIPAQUE IOHEXOL 350 MG/ML SOLN COMPARISON:  None. FINDINGS: Cardiovascular: There is suboptimal opacification of the main pulmonary artery, however no central pulmonary embolism is seen. The heart is normal in size. No pericardial effusion or thickening.  No evidence right heart strain. There is normal three-vessel brachiocephalic anatomy without proximal stenosis. The thoracic aorta is normal in appearance. Mediastinum/Nodes: Scattered subcarinal and right hilar lymph nodes are seen the largest measuring 1.3 cm within the right hilum. Thyroid gland, trachea, and esophagus demonstrate no significant findings. Lungs/Pleura: Multifocal patchy/reticulonodular airspace opacities are seen throughout both lungs predominantly within the posterior right lung base. There is also tortuous cystic area within the posterior left lung base which could represent a bronchocele. There does appear to be mucous plugging seen in the posterior bilateral bronchials. Upper Abdomen: No acute abnormalities present in the visualized portions of the upper abdomen. Musculoskeletal: No chest wall abnormality. No acute or significant osseous findings. Review of the MIP images confirms the above findings. IMPRESSION: Multifocal patchy airspace opacities throughout both lungs, right greater than left, likely consistent with multifocal pneumonia. Suboptimal opacification of the main pulmonary artery, however no central pulmonary embolism is seen. Electronically Signed   By: Jonna Clark M.D.   On: 07/07/2020 01:23    Procedures .Critical Care Performed by: Antony Madura, PA-C Authorized by: Antony Madura, PA-C   Critical care provider statement:    Critical care time (minutes):   45   Critical care was necessary to treat or prevent imminent or life-threatening deterioration of the following conditions:  Sepsis and respiratory failure   Critical care was time spent personally by me on the following activities:  Discussions with consultants, evaluation of patient's response to treatment, examination of patient, ordering and performing treatments and interventions, ordering and review of laboratory studies, ordering and review of radiographic studies, pulse oximetry, re-evaluation of patient's condition, obtaining history from patient or surrogate and review of old charts   (including critical care time)  Medications Ordered in ED Medications  sodium chloride (PF) 0.9 % injection (has no administration in time range)  albuterol (PROVENTIL) (2.5 MG/3ML) 0.083% nebulizer solution 5 mg (has no administration in time range)  ipratropium (ATROVENT) nebulizer solution 0.5 mg (has no administration in time range)  albuterol (VENTOLIN HFA) 108 (90 Base) MCG/ACT inhaler 6 puff (6 puffs Inhalation Given 07/07/20 0025)  ipratropium (ATROVENT HFA) inhaler 2 puff (2 puffs Inhalation Given 07/07/20 0025)  magnesium sulfate IVPB 2 g 50 mL (0 g Intravenous Stopped 07/07/20 0025)  sodium chloride 0.9 % bolus 1,000 mL (0 mLs Intravenous Stopped 07/07/20 0032)  dexamethasone (DECADRON) injection 10 mg (10 mg Intravenous Given 07/06/20 2321)  cefTRIAXone (ROCEPHIN) 1 g in sodium chloride 0.9 % 100 mL IVPB (0 g Intravenous Stopped 07/07/20 0105)  azithromycin (ZITHROMAX) tablet 500 mg (500 mg Oral Given 07/07/20 0026)  iohexol (OMNIPAQUE) 350 MG/ML injection 100 mL (100 mLs Intravenous Contrast Given 07/07/20 0105)    ED Course  I have reviewed the triage vital signs and the nursing notes.  Pertinent labs & imaging results that were available during my care of the patient were reviewed by me and considered in my medical decision making (see chart for details).  Clinical Course as of  07/07/20 0141  Fri Jul 07, 2020  0140 DuoNeb ordered as COVID negative. Dr. Toniann Fail requests addition of blood cultures and lactate. These have been ordered. [KH]    Clinical Course User Index [KH] Antony Madura, PA-C   MDM Rules/Calculators/A&P                          34 year old male admitted to the hospital for management of multifocal pneumonia with acute hypoxic respiratory failure.  Presently in no respiratory distress.  Sats 94 to 95% on 4 L via nasal cannula.  Attempting to wean at this time.  Covid and flu negative.  Dr. Toniann FailKakrakandy to assess in the ED for admission.   Final Clinical Impression(s) / ED Diagnoses Final diagnoses:  Multifocal pneumonia  Acute respiratory failure with hypoxia [facility](HCC)    Rx / DC Orders ED Discharge Orders    None       Antony MaduraHumes, Jaydenn Boccio, PA-C 07/07/20 0142    Ward, Layla MawKristen N, DO 07/07/20 0234

## 2020-07-07 ENCOUNTER — Encounter (HOSPITAL_COMMUNITY): Payer: Self-pay

## 2020-07-07 ENCOUNTER — Emergency Department (HOSPITAL_COMMUNITY): Payer: 59

## 2020-07-07 ENCOUNTER — Other Ambulatory Visit: Payer: Self-pay

## 2020-07-07 DIAGNOSIS — A419 Sepsis, unspecified organism: Principal | ICD-10-CM

## 2020-07-07 DIAGNOSIS — Z79899 Other long term (current) drug therapy: Secondary | ICD-10-CM | POA: Diagnosis not present

## 2020-07-07 DIAGNOSIS — I10 Essential (primary) hypertension: Secondary | ICD-10-CM | POA: Diagnosis present

## 2020-07-07 DIAGNOSIS — G4733 Obstructive sleep apnea (adult) (pediatric): Secondary | ICD-10-CM | POA: Diagnosis present

## 2020-07-07 DIAGNOSIS — J154 Pneumonia due to other streptococci: Secondary | ICD-10-CM | POA: Diagnosis not present

## 2020-07-07 DIAGNOSIS — G8929 Other chronic pain: Secondary | ICD-10-CM | POA: Diagnosis present

## 2020-07-07 DIAGNOSIS — J189 Pneumonia, unspecified organism: Secondary | ICD-10-CM | POA: Diagnosis present

## 2020-07-07 DIAGNOSIS — F17210 Nicotine dependence, cigarettes, uncomplicated: Secondary | ICD-10-CM | POA: Diagnosis present

## 2020-07-07 DIAGNOSIS — Z20822 Contact with and (suspected) exposure to covid-19: Secondary | ICD-10-CM | POA: Diagnosis present

## 2020-07-07 DIAGNOSIS — J9601 Acute respiratory failure with hypoxia: Secondary | ICD-10-CM | POA: Diagnosis present

## 2020-07-07 DIAGNOSIS — R6520 Severe sepsis without septic shock: Secondary | ICD-10-CM | POA: Diagnosis present

## 2020-07-07 DIAGNOSIS — J181 Lobar pneumonia, unspecified organism: Secondary | ICD-10-CM

## 2020-07-07 DIAGNOSIS — J45909 Unspecified asthma, uncomplicated: Secondary | ICD-10-CM | POA: Diagnosis present

## 2020-07-07 DIAGNOSIS — Z7951 Long term (current) use of inhaled steroids: Secondary | ICD-10-CM | POA: Diagnosis not present

## 2020-07-07 LAB — CBC WITH DIFFERENTIAL/PLATELET
Abs Immature Granulocytes: 0.14 10*3/uL — ABNORMAL HIGH (ref 0.00–0.07)
Basophils Absolute: 0 10*3/uL (ref 0.0–0.1)
Basophils Relative: 0 %
Eosinophils Absolute: 0 10*3/uL (ref 0.0–0.5)
Eosinophils Relative: 0 %
HCT: 46 % (ref 39.0–52.0)
Hemoglobin: 15.7 g/dL (ref 13.0–17.0)
Immature Granulocytes: 1 %
Lymphocytes Relative: 3 %
Lymphs Abs: 0.7 10*3/uL (ref 0.7–4.0)
MCH: 32 pg (ref 26.0–34.0)
MCHC: 34.1 g/dL (ref 30.0–36.0)
MCV: 93.9 fL (ref 80.0–100.0)
Monocytes Absolute: 0.6 10*3/uL (ref 0.1–1.0)
Monocytes Relative: 3 %
Neutro Abs: 20.5 10*3/uL — ABNORMAL HIGH (ref 1.7–7.7)
Neutrophils Relative %: 93 %
Platelets: 216 10*3/uL (ref 150–400)
RBC: 4.9 MIL/uL (ref 4.22–5.81)
RDW: 11.9 % (ref 11.5–15.5)
WBC: 22 10*3/uL — ABNORMAL HIGH (ref 4.0–10.5)
nRBC: 0 % (ref 0.0–0.2)

## 2020-07-07 LAB — COMPREHENSIVE METABOLIC PANEL
ALT: 30 U/L (ref 0–44)
AST: 18 U/L (ref 15–41)
Albumin: 3.6 g/dL (ref 3.5–5.0)
Alkaline Phosphatase: 65 U/L (ref 38–126)
Anion gap: 10 (ref 5–15)
BUN: 11 mg/dL (ref 6–20)
CO2: 25 mmol/L (ref 22–32)
Calcium: 8.5 mg/dL — ABNORMAL LOW (ref 8.9–10.3)
Chloride: 102 mmol/L (ref 98–111)
Creatinine, Ser: 0.76 mg/dL (ref 0.61–1.24)
GFR, Estimated: >60 mL/min (ref >60)
Glucose, Bld: 259 mg/dL — ABNORMAL HIGH (ref 70–99)
Potassium: 4 mmol/L (ref 3.5–5.1)
Sodium: 137 mmol/L (ref 135–145)
Total Bilirubin: 0.9 mg/dL (ref 0.3–1.2)
Total Protein: 7 g/dL (ref 6.5–8.1)

## 2020-07-07 LAB — EXPECTORATED SPUTUM ASSESSMENT W GRAM STAIN, RFLX TO RESP C

## 2020-07-07 LAB — TROPONIN I (HIGH SENSITIVITY): Troponin I (High Sensitivity): 2 ng/L (ref <18)

## 2020-07-07 LAB — RESP PANEL BY RT-PCR (FLU A&B, COVID) ARPGX2
Influenza A by PCR: NEGATIVE
Influenza B by PCR: NEGATIVE
SARS Coronavirus 2 by RT PCR: NEGATIVE

## 2020-07-07 LAB — LACTIC ACID, PLASMA: Lactic Acid, Venous: 1 mmol/L (ref 0.5–1.9)

## 2020-07-07 LAB — HIV ANTIBODY (ROUTINE TESTING W REFLEX): HIV Screen 4th Generation wRfx: NONREACTIVE

## 2020-07-07 LAB — STREP PNEUMONIAE URINARY ANTIGEN: Strep Pneumo Urinary Antigen: POSITIVE — AB

## 2020-07-07 MED ORDER — AZITHROMYCIN 250 MG PO TABS
500.0000 mg | ORAL_TABLET | Freq: Once | ORAL | Status: AC
  Administered 2020-07-07: 500 mg via ORAL
  Filled 2020-07-07: qty 2

## 2020-07-07 MED ORDER — ACETAMINOPHEN 650 MG RE SUPP: 650.0000 mg | Freq: Four times a day (QID) | RECTAL | Status: DC | PRN

## 2020-07-07 MED ORDER — GUAIFENESIN ER 600 MG PO TB12
600.0000 mg | ORAL_TABLET | Freq: Two times a day (BID) | ORAL | Status: DC
  Administered 2020-07-07 – 2020-07-10 (×7): 600 mg via ORAL
  Filled 2020-07-07 (×7): qty 1

## 2020-07-07 MED ORDER — GUAIFENESIN 100 MG/5ML PO SOLN
5.0000 mL | ORAL | Status: DC | PRN
  Administered 2020-07-07 – 2020-07-10 (×6): 100 mg via ORAL
  Filled 2020-07-07 (×7): qty 10

## 2020-07-07 MED ORDER — IPRATROPIUM-ALBUTEROL 0.5-2.5 (3) MG/3ML IN SOLN: 3.0000 mL | Freq: Once | RESPIRATORY_TRACT | Status: DC

## 2020-07-07 MED ORDER — MOMETASONE FURO-FORMOTEROL FUM 200-5 MCG/ACT IN AERO
2.0000 | INHALATION_SPRAY | Freq: Two times a day (BID) | RESPIRATORY_TRACT | Status: DC
  Filled 2020-07-07 (×2): qty 8.8

## 2020-07-07 MED ORDER — NICOTINE 21 MG/24HR TD PT24
21.0000 mg | MEDICATED_PATCH | Freq: Every day | TRANSDERMAL | Status: DC
  Administered 2020-07-07 – 2020-07-10 (×4): 21 mg via TRANSDERMAL
  Filled 2020-07-07 (×4): qty 1

## 2020-07-07 MED ORDER — IPRATROPIUM BROMIDE 0.02 % IN SOLN: 0.5000 mg | Freq: Once | RESPIRATORY_TRACT | Status: DC

## 2020-07-07 MED ORDER — ALBUTEROL SULFATE (2.5 MG/3ML) 0.083% IN NEBU: 2.5000 mg | INHALATION_SOLUTION | RESPIRATORY_TRACT | Status: DC | PRN

## 2020-07-07 MED ORDER — SODIUM CHLORIDE 0.9 % IV SOLN
2.0000 g | INTRAVENOUS | Status: DC
  Administered 2020-07-08 – 2020-07-10 (×3): 2 g via INTRAVENOUS
  Filled 2020-07-07 (×2): qty 2
  Filled 2020-07-07: qty 20
  Filled 2020-07-07: qty 2

## 2020-07-07 MED ORDER — LORATADINE 10 MG PO TABS: 10.0000 mg | ORAL_TABLET | Freq: Every day | ORAL | Status: DC

## 2020-07-07 MED ORDER — FLUTICASONE PROPIONATE 50 MCG/ACT NA SUSP
2.0000 | Freq: Every day | NASAL | Status: DC
  Administered 2020-07-07 – 2020-07-10 (×4): 2 via NASAL
  Filled 2020-07-07: qty 16

## 2020-07-07 MED ORDER — ACETAMINOPHEN 325 MG PO TABS
650.0000 mg | ORAL_TABLET | Freq: Four times a day (QID) | ORAL | Status: DC | PRN
  Administered 2020-07-07 – 2020-07-09 (×6): 650 mg via ORAL
  Filled 2020-07-07 (×7): qty 2

## 2020-07-07 MED ORDER — METHYLPREDNISOLONE SODIUM SUCC 125 MG IJ SOLR
60.0000 mg | INTRAMUSCULAR | Status: DC
  Administered 2020-07-07 – 2020-07-09 (×3): 60 mg via INTRAVENOUS
  Filled 2020-07-07 (×4): qty 2

## 2020-07-07 MED ORDER — SODIUM CHLORIDE (PF) 0.9 % IJ SOLN
INTRAMUSCULAR | Status: AC
  Filled 2020-07-07: qty 50

## 2020-07-07 MED ORDER — IOHEXOL 350 MG/ML SOLN
100.0000 mL | Freq: Once | INTRAVENOUS | Status: AC | PRN
  Administered 2020-07-07: 100 mL via INTRAVENOUS

## 2020-07-07 MED ORDER — BUPRENORPHINE HCL-NALOXONE HCL 8-2 MG SL SUBL
1.0000 | SUBLINGUAL_TABLET | Freq: Three times a day (TID) | SUBLINGUAL | Status: DC
  Administered 2020-07-07 – 2020-07-10 (×9): 1 via SUBLINGUAL
  Filled 2020-07-07 (×9): qty 1

## 2020-07-07 MED ORDER — ENOXAPARIN SODIUM 40 MG/0.4ML ~~LOC~~ SOLN
40.0000 mg | SUBCUTANEOUS | Status: DC
  Filled 2020-07-07: qty 0.4

## 2020-07-07 MED ORDER — AZITHROMYCIN 500 MG IV SOLR
500.0000 mg | INTRAVENOUS | Status: DC
  Administered 2020-07-08 – 2020-07-09 (×2): 500 mg via INTRAVENOUS
  Filled 2020-07-07 (×2): qty 500

## 2020-07-07 MED ORDER — SODIUM CHLORIDE 0.9 % IV SOLN
1.0000 g | Freq: Once | INTRAVENOUS | Status: AC
  Administered 2020-07-07: 1 g via INTRAVENOUS
  Filled 2020-07-07: qty 10

## 2020-07-07 MED ORDER — IPRATROPIUM-ALBUTEROL 0.5-2.5 (3) MG/3ML IN SOLN: 3.0000 mL | Freq: Three times a day (TID) | RESPIRATORY_TRACT | Status: DC

## 2020-07-07 MED ORDER — IPRATROPIUM-ALBUTEROL 0.5-2.5 (3) MG/3ML IN SOLN
3.0000 mL | Freq: Three times a day (TID) | RESPIRATORY_TRACT | Status: DC
  Administered 2020-07-07 – 2020-07-08 (×3): 3 mL via RESPIRATORY_TRACT
  Filled 2020-07-07 (×3): qty 3

## 2020-07-07 MED ORDER — ALBUTEROL SULFATE (2.5 MG/3ML) 0.083% IN NEBU: 5.0000 mg | INHALATION_SOLUTION | Freq: Once | RESPIRATORY_TRACT | Status: DC

## 2020-07-07 MED ORDER — SODIUM CHLORIDE 0.9 % IV SOLN: INTRAVENOUS | Status: AC

## 2020-07-07 MED ORDER — BUPRENORPHINE HCL-NALOXONE HCL 8-2 MG SL FILM: 1.0000 | ORAL_FILM | Freq: Three times a day (TID) | SUBLINGUAL | Status: DC

## 2020-07-07 NOTE — Progress Notes (Addendum)
PROGRESS NOTE    Roy SADA  Richmond:[id number] DOB: 09/09/1985 DOA: 07/06/2020 PCP: Vivien Presto, MD    Chief Complaint  Patient presents with  . Respiratory Distress    Brief Narrative:  Roy Richmond is a 34 y.o. male with history of asthma and tobacco abuse presents to the ER with complaints of worsening shortness of breath.  Patient states over the last 4 days he started getting more short of breath with productive cough pleuritic type of chest pain.  And last 2 days he has been running subjective feeling of fever chills.  Phlegm becoming more discolored.  Was running fever as high has 103 F.  Patient became hypoxic with home oxygen saturation up to around 75% at this point patient decided to come to the ER.  Patient denies any recent travel sick contacts or any contact with animals or birds.  ED Course: In the ER patient was requiring 3 L oxygen to maintain sats more than 90% with D-dimer mildly elevated CT angiogram of the chest was done which shows multifocal pneumonia.  Labs are showing WBC count of 19.8.  Sodium 134.  Covid test was negative flu was negative.  Patient started on empiric antibiotics after blood cultures admitted for acute respiratory failure with hypoxia secondary to multifocal pneumonia.   Subjective:  T-max 100.5 Short of breath, productive cough with green sputum, denies hemoptysis Report pleuritic chest pain, not able to take deep breath  Assessment & Plan:   Principal Problem:   CAP (community acquired pneumonia) Active Problems:   Asthma   Acute respiratory failure with hypoxia (HCC)   Multifocal pneumonia   Acute hypoxic respiratory failure/bilateral pneumonia/Sever sepsis present on admission -per ED triage "Patient BIB EMS from home with c/o sob on exertion with productive cough of green sputum for 5 days, inhaler was not helping, 88% on RA." -He has fever, leukocytosis, sinus tachycardia, hypoxia on presentation, meet severe  sepsis criteria -CT chest personally reviewed with patient showed" bilateral multifocal pneumonia" -Blood culture in process, sputum culture in process, urine strep pneumo positive -Continue current antibiotics, wean oxygen as able  History of asthma, every day smoker Currently he is wheezing, start IV steroid, nebulizer, Mucinex, continue antibiotics  Elevated fasting blood glucose, no prior history of diabetes Possible stress and steroid induced check A1c   Chronic pain; on Suboxone film under the tongue in the morning and bedtime   Current every day smoker 1 pack a day Smoking cessation education provided, nicotine patch prescribed  Body mass index is 33.36 kg/m.   DVT prophylaxis: enoxaparin (LOVENOX) injection 40 mg Start: 07/07/20 1000, patient refused Lovenox, will use SCD, encourage ambulation   Code Status: Full Family Communication: Patient Disposition:   Status is: Inpatient  Dispo: The patient is from: Home              Anticipated d/c is to: Home              Anticipated d/c date is:               Patient currently is not medically ready to discharge  Consultants:   None  Procedures:   None  Antimicrobials:    Rocephin and Zithromax    Objective: Vitals:   07/07/20 0222 07/07/20 0243 07/07/20 0632 07/07/20 1039  BP: (!) 153/71 135/74 130/86 (!) 143/81  Pulse: 91 91 86 77  Resp: 18 18 18  (!) 22  Temp: 97.9 F (36.6 C) 98.2 F (36.8 C) (!)  97.5 F (36.4 C) 97.9 F (36.6 C)  TempSrc: Oral Oral Oral   SpO2: 98% 95% 95% 95%  Weight:      Height:        Intake/Output Summary (Last 24 hours) at 07/07/2020 1330 Last data filed at 07/07/2020 0530 Gross per 24 hour  Intake 1569.16 ml  Output 725 ml  Net 844.16 ml   Filed Weights   07/06/20 2243  Weight: 111.6 kg    Examination:  General exam: Anxious  Respiratory system: Coarse breath sound, bilateral wheezing.  Not able to take deep breath due to pleuritic chest  pain Cardiovascular system: Tachycardia has resolved , now RRR. No JVD, no murmur, No pedal edema. Gastrointestinal system: Abdomen is nondistended, soft and nontender.  Normal bowel sounds heard. Central nervous system: Alert and oriented. No focal neurological deficits. Extremities: Symmetric 5 x 5 power. Skin: No rashes, lesions or ulcers Psychiatry: Judgement and insight appear normal. Mood & affect appropriate.     Data Reviewed: I have personally reviewed following labs and imaging studies  CBC: Recent Labs  Lab 07/06/20 2303 07/07/20 0415  WBC 19.8* 22.0*  NEUTROABS 17.4* 20.5*  HGB 16.5 15.7  HCT 48.6 46.0  MCV 93.3 93.9  PLT 225 216    Basic Metabolic Panel: Recent Labs  Lab 07/06/20 2303 07/07/20 0415  NA 134* 137  K 4.1 4.0  CL 98 102  CO2 26 25  GLUCOSE 119* 259*  BUN 12 11  CREATININE 0.74 0.76  CALCIUM 8.7* 8.5*    GFR: Estimated Creatinine Clearance: 166.3 mL/min (by C-G formula based on SCr of 0.76 mg/dL).  Liver Function Tests: Recent Labs  Lab 07/07/20 0415  AST 18  ALT 30  ALKPHOS 65  BILITOT 0.9  PROT 7.0  ALBUMIN 3.6    CBG: No results for input(s): GLUCAP in the last 168 hours.   Recent Results (from the past 240 hour(s))  Resp Panel by RT-PCR (Flu A&B, Covid) Nasopharyngeal Swab     Status: None   Collection Time: 07/06/20 11:04 PM   Specimen: Nasopharyngeal Swab; Nasopharyngeal(NP) swabs in vial transport medium  Result Value Ref Range Status   SARS Coronavirus 2 by RT PCR NEGATIVE NEGATIVE Final    Comment: (NOTE) SARS-CoV-2 target nucleic acids are NOT DETECTED.  The SARS-CoV-2 RNA is generally detectable in upper respiratory specimens during the acute phase of infection. The lowest concentration of SARS-CoV-2 viral copies this assay can detect is 138 copies/mL. A negative result does not preclude SARS-Cov-2 infection and should not be used as the sole basis for treatment or other patient management decisions. A  negative result may occur with  improper specimen collection/handling, submission of specimen other than nasopharyngeal swab, presence of viral mutation(s) within the areas targeted by this assay, and inadequate number of viral copies(<138 copies/mL). A negative result must be combined with clinical observations, patient history, and epidemiological information. The expected result is Negative.  Fact Sheet for Patients:  BloggerCourse.comhttps://www.fda.gov/media/152166/download  Fact Sheet for Healthcare Providers:  SeriousBroker.ithttps://www.fda.gov/media/152162/download  This test is no t yet approved or cleared by the [place] FDA and  has been authorized for detection and/or diagnosis of SARS-CoV-2 by FDA under an Emergency Use Authorization (EUA). This EUA will remain  in effect (meaning this test can be used) for the duration of the COVID-19 declaration under Section 564(b)(1) of the Act, 21 U.S.C.section 360bbb-3(b)(1), unless the authorization is terminated  or revoked sooner.       Influenza A by PCR  NEGATIVE NEGATIVE Final   Influenza B by PCR NEGATIVE NEGATIVE Final    Comment: (NOTE) The Xpert Xpress SARS-CoV-2/FLU/RSV plus assay is intended as an aid in the diagnosis of influenza from Nasopharyngeal swab specimens and should not be used as a sole basis for treatment. Nasal washings and aspirates are unacceptable for Xpert Xpress SARS-CoV-2/FLU/RSV testing.  Fact Sheet for Patients: BloggerCourse.com  Fact Sheet for Healthcare Providers: SeriousBroker.it  This test is not yet approved or cleared by the [place] FDA and has been authorized for detection and/or diagnosis of SARS-CoV-2 by FDA under an Emergency Use Authorization (EUA). This EUA will remain in effect (meaning this test can be used) for the duration of the COVID-19 declaration under Section 564(b)(1) of the Act, 21 U.S.C. section 360bbb-3(b)(1), unless the authorization  is terminated or revoked.  Performed at [facility], [address], [place], [place] [address]   Culture, sputum-assessment     Status: None   Collection Time: 07/07/20  3:23 AM   Specimen: Sputum  Result Value Ref Range Status   Specimen Description SPU  Final   Special Requests NONE  Final   Sputum evaluation   Final    THIS SPECIMEN IS ACCEPTABLE FOR SPUTUM CULTURE Performed at [facility], [address] [place], [place] [address]    Report Status 07/07/2020 FINAL  Final         Radiology Studies: DG Chest 2 View  Result Date: 07/06/2020 CLINICAL DATA:  Hypoxia cough EXAM: CHEST - 2 VIEW COMPARISON:  08/24/2013 FINDINGS: Patchy perihilar and basilar airspace opacities. No pleural effusion. Normal heart size. No pneumothorax IMPRESSION: Patchy perihilar and basilar airspace opacities suspicious for multifocal pneumonia, possible atypical or viral pneumonia. Electronically Signed   By: Jasmine Pang M.D.   On: 07/06/2020 23:19   CT Angio Chest PE W and/or Wo Contrast  Result Date: 07/07/2020 CLINICAL DATA:  Positive D-dimer shortness of breath EXAM: CT ANGIOGRAPHY CHEST WITH CONTRAST TECHNIQUE: Multidetector CT imaging of the chest was performed using the standard protocol during bolus administration of intravenous contrast. Multiplanar CT image reconstructions and MIPs were obtained to evaluate the vascular anatomy. CONTRAST:  OMNIPAQUE IOHEXOL 350 MG/ML SOLN COMPARISON:  None. FINDINGS: Cardiovascular: There is suboptimal opacification of the main pulmonary artery, however no central pulmonary embolism is seen. The heart is normal in size. No pericardial effusion or thickening. No evidence right heart strain. There is normal three-vessel brachiocephalic anatomy without proximal stenosis. The thoracic aorta is normal in appearance. Mediastinum/Nodes: Scattered subcarinal and right hilar lymph nodes are seen the largest measuring 1.3 cm  within the right hilum. Thyroid gland, trachea, and esophagus demonstrate no significant findings. Lungs/Pleura: Multifocal patchy/reticulonodular airspace opacities are seen throughout both lungs predominantly within the posterior right lung base. There is also tortuous cystic area within the posterior left lung base which could represent a bronchocele. There does appear to be mucous plugging seen in the posterior bilateral bronchials. Upper Abdomen: No acute abnormalities present in the visualized portions of the upper abdomen. Musculoskeletal: No chest wall abnormality. No acute or significant osseous findings. Review of the MIP images confirms the above findings. IMPRESSION: Multifocal patchy airspace opacities throughout both lungs, right greater than left, likely consistent with multifocal pneumonia. Suboptimal opacification of the main pulmonary artery, however no central pulmonary embolism is seen. Electronically Signed   By: Jonna Clark M.D.   On: 07/07/2020 01:23        Scheduled Meds: . Buprenorphine HCl-Naloxone  HCl  1 Film Sublingual TID  . enoxaparin (LOVENOX) injection  40 mg Subcutaneous Q24H  . fluticasone  2 spray Each Nare Daily  . guaiFENesin  600 mg Oral BID  . ipratropium-albuterol  3 mL Nebulization TID  . methylPREDNISolone (SOLU-MEDROL) injection  60 mg Intravenous Q24H  . mometasone-formoterol  2 puff Inhalation BID  . nicotine  21 mg Transdermal Daily   Continuous Infusions: . sodium chloride 100 mL/hr at 07/07/20 0502  . [START ON 07/08/2020] azithromycin    . [START ON 07/08/2020] cefTRIAXone (ROCEPHIN)  IV       LOS: 0 days   Time spent: Greater than 50% of this time was spent in counseling, explanation of diagnosis, planning of further management, and coordination of care.  I have personally reviewed and interpreted on  07/07/2020 daily labs, tele strips, imagings as discussed above under date review session and assessment and plans.  I reviewed  all nursing notes, pharmacy notes,  vitals, pertinent old records  I have discussed plan of care as described above with RN , patient  on 07/07/2020  Voice Recognition /Dragon dictation system was used to create this note, attempts have been made to correct errors. Please contact the author with questions and/or clarifications.   Albertine Grates, MD PhD FACP [facility]  Available via Epic secure chat 7am-7pm for nonurgent issues Please page for urgent issues To page the attending provider between 7A-7P or the covering provider during after hours 7P-7A, please log into the web site [email or web address] and access using universal [place] password for that web site. If you do not have the password, please call the hospital operator.    07/07/2020, 1:30 PM

## 2020-07-07 NOTE — H&P (Signed)
History and Physical    Roy Richmond JGG:[id number] DOB: 26-Jul-1986 DOA: 07/06/2020  PCP: Vivien Presto, MD  Patient coming from: Home.  Chief Complaint: Shortness of breath.  HPI: Roy Richmond is a 34 y.o. male with history of asthma and tobacco abuse presents to the ER with complaints of worsening shortness of breath.  Patient states over the last 4 days he started getting more short of breath with productive cough pleuritic type of chest pain.  And last 2 days he has been running subjective feeling of fever chills.  Phlegm becoming more discolored.  Was running fever as high has 103 F.  Patient became hypoxic with home oxygen saturation up to around 75% at this point patient decided to come to the ER.  Patient denies any recent travel sick contacts or any contact with animals or birds.  ED Course: In the ER patient was requiring 3 L oxygen to maintain sats more than 90% with D-dimer mildly elevated CT angiogram of the chest was done which shows multifocal pneumonia.  Labs are showing WBC count of 19.8.  Sodium 134.  Covid test was negative flu was negative.  Patient started on empiric antibiotics after blood cultures admitted for acute respiratory failure with hypoxia secondary to multifocal pneumonia.  Review of Systems: As per HPI, rest all negative.   Past Medical History:  Diagnosis Date  . Asthma   . HTN (hypertension)     History reviewed. No pertinent surgical history.   reports that he has been smoking cigarettes. He has a 0.20 pack-year smoking history. He has never used smokeless tobacco. He reports current alcohol use. He reports that he does not use drugs.  No Known Allergies  Family History  Family history unknown: Yes    Prior to Admission medications   Medication Sig Start Date End Date Taking? Authorizing Provider  albuterol (PROVENTIL HFA;VENTOLIN HFA) 108 (90 BASE) MCG/ACT inhaler Inhale 2 puffs into the lungs every 6 (six) hours as needed for  wheezing. 08/16/13   Oretha Milch, MD  budesonide-formoterol (SYMBICORT) 160-4.5 MCG/ACT inhaler Inhale 2 puffs into the lungs 2 (two) times daily. 07/26/13   Oretha Milch, MD  doxycycline (VIBRA-TABS) 100 MG tablet Take 1 tablet (100 mg total) by mouth daily. 08/24/13   Oretha Milch, MD  methadone (DOLOPHINE) 10 MG/ML solution Take 80 mg by mouth daily.     [provider]  predniSONE (DELTASONE) 10 MG tablet Take 4 po qd X4 days, then 3 po qd X4 days, then 2 po qd X4 days, then 1 po qd X4 days 08/24/13   Oretha Milch, MD    Physical Exam: Constitutional: Moderately built and nourished. Vitals:   07/07/20 0200 07/07/20 0205 07/07/20 0222 07/07/20 0243  BP:  130/75 (!) 153/71 135/74  Pulse: 93 97 91 91  Resp:  18 18 18   Temp:   97.9 F (36.6 C) 98.2 F (36.8 C)  TempSrc:   Oral Oral  SpO2: 95% 96% 98% 95%  Weight:      Height:       Eyes: Anicteric no pallor. ENMT: No discharge from the ears eyes nose or mouth. Neck: No mass felt.  No neck rigidity. Respiratory: No rhonchi or crepitations. Cardiovascular: S1-S2 heard. Abdomen: Soft nontender bowel sounds present. Musculoskeletal: No edema. Skin: No rash. Neurologic: Alert awake oriented to time place and person.  Moves all extremities. Psychiatric: Appears normal.  Normal affect.   Labs on Admission: I have  personally reviewed following labs and imaging studies  CBC: Recent Labs  Lab 07/06/20 2303  WBC 19.8*  NEUTROABS 17.4*  HGB 16.5  HCT 48.6  MCV 93.3  PLT 225   Basic Metabolic Panel: Recent Labs  Lab 07/06/20 2303  NA 134*  K 4.1  CL 98  CO2 26  GLUCOSE 119*  BUN 12  CREATININE 0.74  CALCIUM 8.7*   GFR: Estimated Creatinine Clearance: 166.3 mL/min (by C-G formula based on SCr of 0.74 mg/dL). Liver Function Tests: No results for input(s): AST, ALT, ALKPHOS, BILITOT, PROT, ALBUMIN in the last 168 hours. No results for input(s): LIPASE, AMYLASE in the last 168 hours. No results for  input(s): AMMONIA in the last 168 hours. Coagulation Profile: No results for input(s): INR, PROTIME in the last 168 hours. Cardiac Enzymes: No results for input(s): CKTOTAL, CKMB, CKMBINDEX, TROPONINI in the last 168 hours. BNP (last 3 results) No results for input(s): PROBNP in the last 8760 hours. HbA1C: No results for input(s): HGBA1C in the last 72 hours. CBG: No results for input(s): GLUCAP in the last 168 hours. Lipid Profile: No results for input(s): CHOL, HDL, LDLCALC, TRIG, CHOLHDL, LDLDIRECT in the last 72 hours. Thyroid Function Tests: No results for input(s): TSH, T4TOTAL, FREET4, T3FREE, THYROIDAB in the last 72 hours. Anemia Panel: No results for input(s): VITAMINB12, FOLATE, FERRITIN, TIBC, IRON, RETICCTPCT in the last 72 hours. Urine analysis: No results found for: COLORURINE, APPEARANCEUR, LABSPEC, PHURINE, GLUCOSEU, HGBUR, BILIRUBINUR, KETONESUR, PROTEINUR, UROBILINOGEN, NITRITE, LEUKOCYTESUR Sepsis Labs: @LABRCNTIP (procalcitonin:4,lacticidven:4) ) Recent Results (from the past 240 hour(s))  Resp Panel by RT-PCR (Flu A&B, Covid) Nasopharyngeal Swab     Status: None   Collection Time: 07/06/20 11:04 PM   Specimen: Nasopharyngeal Swab; Nasopharyngeal(NP) swabs in vial transport medium  Result Value Ref Range Status   SARS Coronavirus 2 by RT PCR NEGATIVE NEGATIVE Final    Comment: (NOTE) SARS-CoV-2 target nucleic acids are NOT DETECTED.  The SARS-CoV-2 RNA is generally detectable in upper respiratory specimens during the acute phase of infection. The lowest concentration of SARS-CoV-2 viral copies this assay can detect is 138 copies/mL. A negative result does not preclude SARS-Cov-2 infection and should not be used as the sole basis for treatment or other patient management decisions. A negative result may occur with  improper specimen collection/handling, submission of specimen other than nasopharyngeal swab, presence of viral mutation(s) within the areas  targeted by this assay, and inadequate number of viral copies(<138 copies/mL). A negative result must be combined with clinical observations, patient history, and epidemiological information. The expected result is Negative.  Fact Sheet for Patients:  14/09/21  Fact Sheet for Healthcare Providers:  BloggerCourse.com  This test is no t yet approved or cleared by the [employer] FDA and  has been authorized for detection and/or diagnosis of SARS-CoV-2 by FDA under an Emergency Use Authorization (EUA). This EUA will remain  in effect (meaning this test can be used) for the duration of the COVID-19 declaration under Section 564(b)(1) of the Act, 21 U.S.C.section 360bbb-3(b)(1), unless the authorization is terminated  or revoked sooner.       Influenza A by PCR NEGATIVE NEGATIVE Final   Influenza B by PCR NEGATIVE NEGATIVE Final    Comment: (NOTE) The Xpert Xpress SARS-CoV-2/FLU/RSV plus assay is intended as an aid in the diagnosis of influenza from Nasopharyngeal swab specimens and should not be used as a sole basis for treatment. Nasal washings and aspirates are unacceptable for Xpert Xpress SARS-CoV-2/FLU/RSV testing.  Fact  Sheet for Patients: BloggerCourse.com  Fact Sheet for Healthcare Providers: [employer]ious[employer]ker.it  This test is not yet approved or cleared by the [place] FDA and has been authorized for detection and/or diagnosis of SARS-CoV-2 by FDA under an Emergency Use Authorization (EUA). This EUA will remain in effect (meaning this test can be used) for the duration of the COVID-19 declaration under Section 564(b)(1) of the Act, 21 U.S.C. section 360bbb-3(b)(1), unless the authorization is terminated or revoked.  Performed at [facility], [address], [place], [place] [address]      Radiological Exams on Admission: DG Chest 2  View  Result Date: 07/06/2020 CLINICAL DATA:  Hypoxia cough EXAM: CHEST - 2 VIEW COMPARISON:  08/24/2013 FINDINGS: Patchy perihilar and basilar airspace opacities. No pleural effusion. Normal heart size. No pneumothorax IMPRESSION: Patchy perihilar and basilar airspace opacities suspicious for multifocal pneumonia, possible atypical or viral pneumonia. Electronically Signed   By: Jasmine Pang M.D.   On: 07/06/2020 23:19   CT Angio Chest PE W and/or Wo Contrast  Result Date: 07/07/2020 CLINICAL DATA:  Positive D-dimer shortness of breath EXAM: CT ANGIOGRAPHY CHEST WITH CONTRAST TECHNIQUE: Multidetector CT imaging of the chest was performed using the standard protocol during bolus administration of intravenous contrast. Multiplanar CT image reconstructions and MIPs were obtained to evaluate the vascular anatomy. CONTRAST:  OMNIPAQUE IOHEXOL 350 MG/ML SOLN COMPARISON:  None. FINDINGS: Cardiovascular: There is suboptimal opacification of the main pulmonary artery, however no central pulmonary embolism is seen. The heart is normal in size. No pericardial effusion or thickening. No evidence right heart strain. There is normal three-vessel brachiocephalic anatomy without proximal stenosis. The thoracic aorta is normal in appearance. Mediastinum/Nodes: Scattered subcarinal and right hilar lymph nodes are seen the largest measuring 1.3 cm within the right hilum. Thyroid gland, trachea, and esophagus demonstrate no significant findings. Lungs/Pleura: Multifocal patchy/reticulonodular airspace opacities are seen throughout both lungs predominantly within the posterior right lung base. There is also tortuous cystic area within the posterior left lung base which could represent a bronchocele. There does appear to be mucous plugging seen in the posterior bilateral bronchials. Upper Abdomen: No acute abnormalities present in the visualized portions of the upper abdomen. Musculoskeletal: No chest wall abnormality. No  acute or significant osseous findings. Review of the MIP images confirms the above findings. IMPRESSION: Multifocal patchy airspace opacities throughout both lungs, right greater than left, likely consistent with multifocal pneumonia. Suboptimal opacification of the main pulmonary artery, however no central pulmonary embolism is seen. Electronically Signed   By: Jonna Clark M.D.   On: 07/07/2020 01:23     Assessment/Plan Principal Problem:   CAP (community acquired pneumonia) Active Problems:   Asthma   Acute respiratory failure with hypoxia (HCC)   Multifocal pneumonia    1. Community-acquired pneumonia/multifocal pneumonia with acute respiratory failure with hypoxia requiring 3 L oxygen to maintain sats started on empiric antibiotics follow cultures including blood and sputum check urine for Legionella strep antigen. 2. History of bronchial asthma at the time of my exam not wheezing.  We'll continue inhalers. 3. Tobacco abuse advised about quitting. 4. Pleuritic type of chest pain likely from pneumonia.  EKG and cardiac markers are pending.  Given that patient is acute respiratory failure requiring 3 L oxygen with multifocal pneumonia will need close monitoring for any further worsening in inpatient status.   DVT prophylaxis: Lovenox. Code Status: Full code. Family Communication: Discussed with patient. Disposition Plan: Home. Consults called: None. Admission status: Inpatient.  Jamice Carreno N KEduard Closakrakandy MD [facility] Pager [phone].  If 7PM-7AM, please contact night-coverage [email or web address] Password [email or web address]  07/07/2020, 2:55 AM

## 2020-07-07 NOTE — ED Notes (Signed)
Patient transported to CT 

## 2020-07-08 LAB — BASIC METABOLIC PANEL
Anion gap: 12 (ref 5–15)
BUN: 14 mg/dL (ref 6–20)
CO2: 23 mmol/L (ref 22–32)
Calcium: 8.6 mg/dL — ABNORMAL LOW (ref 8.9–10.3)
Chloride: 106 mmol/L (ref 98–111)
Creatinine, Ser: 0.63 mg/dL (ref 0.61–1.24)
GFR, Estimated: >60 mL/min (ref >60)
Glucose, Bld: 202 mg/dL — ABNORMAL HIGH (ref 70–99)
Potassium: 4.2 mmol/L (ref 3.5–5.1)
Sodium: 141 mmol/L (ref 135–145)

## 2020-07-08 LAB — HEMOGLOBIN A1C
Hgb A1c MFr Bld: 5.8 % — ABNORMAL HIGH (ref 4.8–5.6)
Mean Plasma Glucose: 119.76 mg/dL

## 2020-07-08 LAB — CBC WITH DIFFERENTIAL/PLATELET
Abs Immature Granulocytes: 0.19 10*3/uL — ABNORMAL HIGH (ref 0.00–0.07)
Basophils Absolute: 0 10*3/uL (ref 0.0–0.1)
Basophils Relative: 0 %
Eosinophils Absolute: 0 10*3/uL (ref 0.0–0.5)
Eosinophils Relative: 0 %
HCT: 43 % (ref 39.0–52.0)
Hemoglobin: 14.4 g/dL (ref 13.0–17.0)
Immature Granulocytes: 1 %
Lymphocytes Relative: 5 %
Lymphs Abs: 1.1 10*3/uL (ref 0.7–4.0)
MCH: 31.7 pg (ref 26.0–34.0)
MCHC: 33.5 g/dL (ref 30.0–36.0)
MCV: 94.7 fL (ref 80.0–100.0)
Monocytes Absolute: 1.1 10*3/uL — ABNORMAL HIGH (ref 0.1–1.0)
Monocytes Relative: 5 %
Neutro Abs: 20.3 10*3/uL — ABNORMAL HIGH (ref 1.7–7.7)
Neutrophils Relative %: 89 %
Platelets: 221 10*3/uL (ref 150–400)
RBC: 4.54 MIL/uL (ref 4.22–5.81)
RDW: 11.9 % (ref 11.5–15.5)
WBC: 22.7 10*3/uL — ABNORMAL HIGH (ref 4.0–10.5)
nRBC: 0 % (ref 0.0–0.2)

## 2020-07-08 LAB — MAGNESIUM: Magnesium: 2.3 mg/dL (ref 1.7–2.4)

## 2020-07-08 LAB — TSH: TSH: 1.7 u[IU]/mL (ref 0.350–4.500)

## 2020-07-08 MED ORDER — IPRATROPIUM-ALBUTEROL 0.5-2.5 (3) MG/3ML IN SOLN
3.0000 mL | RESPIRATORY_TRACT | Status: DC
  Administered 2020-07-08: 16:00:00 3 mL via RESPIRATORY_TRACT
  Filled 2020-07-08: qty 3

## 2020-07-08 MED ORDER — IPRATROPIUM-ALBUTEROL 0.5-2.5 (3) MG/3ML IN SOLN
3.0000 mL | Freq: Three times a day (TID) | RESPIRATORY_TRACT | Status: DC
  Administered 2020-07-09 – 2020-07-10 (×4): 3 mL via RESPIRATORY_TRACT
  Filled 2020-07-08 (×4): qty 3

## 2020-07-08 MED ORDER — ALUM & MAG HYDROXIDE-SIMETH 200-200-20 MG/5ML PO SUSP
30.0000 mL | ORAL | Status: DC | PRN
  Administered 2020-07-08: 01:00:00 30 mL via ORAL
  Filled 2020-07-08: qty 30

## 2020-07-08 NOTE — Progress Notes (Signed)
PROGRESS NOTE    Roy Richmond  QTM:[id number] DOB: 03-Jun-1986 DOA: 07/06/2020 PCP: Vivien Presto, MD    Chief Complaint  Patient presents with   Respiratory Distress    Brief Narrative:  Roy Richmond is a 34 y.o. male with history of asthma and tobacco abuse presents to the ER with complaints of worsening shortness of breath.  Patient states over the last 4 days he started getting more short of breath with productive cough pleuritic type of chest pain.  And last 2 days he has been running subjective feeling of fever chills.  Phlegm becoming more discolored.  Was running fever as high has 103 F.  Patient became hypoxic with home oxygen saturation up to around 75% at this point patient decided to come to the ER.  Patient denies any recent travel sick contacts or any contact with animals or birds.  ED Course: In the ER patient was requiring 3 L oxygen to maintain sats more than 90% with D-dimer mildly elevated CT angiogram of the chest was done which shows multifocal pneumonia.  Labs are showing WBC count of 19.8.  Sodium 134.  Covid test was negative flu was negative.  Patient started on empiric antibiotics after blood cultures admitted for acute respiratory failure with hypoxia secondary to multifocal pneumonia.   Subjective:  Afebrile last 24 hours, tachycardia has improved, blood pressure stable, he is on room air this morning Continue to report Short of breath, productive cough with green sputum with occasional blood-tinged sputum Continue to report pleuritic chest pain, not able to take deep breath, wheezing  Assessment & Plan:   Principal Problem:   CAP (community acquired pneumonia) Active Problems:   Asthma   Acute respiratory failure with hypoxia (HCC)   Multifocal pneumonia   Acute hypoxic respiratory failure/bilateral pneumonia/Sever sepsis present on admission -per ED triage "Patient BIB EMS from home with c/o sob on exertion with productive cough of  green sputum for 5 days, inhaler was not helping, 88% on RA." -He has fever, leukocytosis, sinus tachycardia, hypoxia on presentation, meet severe sepsis criteria -CT chest personally reviewed with patient showed" bilateral multifocal pneumonia" -Blood culture no growth, sputum culture in process, urine strep positive for pneumo positive -Continue current antibiotics, wean oxygen as able  History of asthma, every day smoker Currently he is wheezing, started onIV steroid, increase nebulizer, Mucinex, continue antibiotics  Elevated fasting blood glucose, no prior history of diabetes Possible stress and steroid induced  A1c 5.8   Chronic pain; on Suboxone film under the tongue in the morning and bedtime   Current every day smoker 1 pack a day Smoking cessation education provided, nicotine patch prescribed  Body mass index is 33.36 kg/m.   DVT prophylaxis: He declined Lovenox injection, will provide SCDs and encourage ambulation   Code Status: Full Family Communication: Patient Disposition:   Status is: Inpatient  Dispo: The patient is from: Home              Anticipated d/c is to: Home              Anticipated d/c date is:               Patient currently is not medically ready to discharge  Consultants:   None  Procedures:   None  Antimicrobials:    Rocephin and Zithromax    Objective: Vitals:   07/07/20 1832 07/07/20 2016 07/08/20 0428 07/08/20 0811  BP:  120/70 139/85   Pulse:  84  86   Resp:  17 18   Temp:  98.1 F (36.7 C) 98.1 F (36.7 C)   TempSrc:  Oral Oral   SpO2: 95%  95% 95%  Weight:      Height:        Intake/Output Summary (Last 24 hours) at 07/08/2020 1254 Last data filed at 07/08/2020 1034 Gross per 24 hour  Intake 2283.36 ml  Output 476 ml  Net 1807.36 ml   Filed Weights   07/06/20 2243  Weight: 111.6 kg    Examination:  General exam: Anxious  Respiratory system: Coarse breath sound, bilateral wheezing.  Not able to take  deep breath due to pleuritic chest pain Cardiovascular system: Tachycardia has resolved , now RRR. No JVD, no murmur, No pedal edema. Gastrointestinal system: Abdomen is nondistended, soft and nontender.  Normal bowel sounds heard. Central nervous system: Alert and oriented. No focal neurological deficits. Extremities: Symmetric 5 x 5 power. Skin: No rashes, lesions or ulcers Psychiatry: Judgement and insight appear normal. Mood & affect appropriate.     Data Reviewed: I have personally reviewed following labs and imaging studies  CBC: Recent Labs  Lab 07/06/20 2303 07/07/20 0415 07/08/20 0423  WBC 19.8* 22.0* 22.7*  NEUTROABS 17.4* 20.5* 20.3*  HGB 16.5 15.7 14.4  HCT 48.6 46.0 43.0  MCV 93.3 93.9 94.7  PLT 225 216 221    Basic Metabolic Panel: Recent Labs  Lab 07/06/20 2303 07/07/20 0415 07/08/20 0423  NA 134* 137 141  K 4.1 4.0 4.2  CL 98 102 106  CO2 GLUCOSE 119* 259* 202*  BUN CREATININE 0.74 0.76 0.63  CALCIUM 8.7* 8.5* 8.6*  MG  --   --  2.3    GFR: Estimated Creatinine Clearance: 166.3 mL/min (by C-G formula based on SCr of 0.63 mg/dL).  Liver Function Tests: Recent Labs  Lab 07/07/20 0415  AST 18  ALT 30  ALKPHOS 65  BILITOT 0.9  PROT 7.0  ALBUMIN 3.6    CBG: No results for input(s): GLUCAP in the last 168 hours.   Recent Results (from the past 240 hour(s))  Resp Panel by RT-PCR (Flu A&B, Covid) Nasopharyngeal Swab     Status: None   Collection Time: 07/06/20 11:04 PM   Specimen: Nasopharyngeal Swab; Nasopharyngeal(NP) swabs in vial transport medium  Result Value Ref Range Status   SARS Coronavirus 2 by RT PCR NEGATIVE NEGATIVE Final    Comment: (NOTE) SARS-CoV-2 target nucleic acids are NOT DETECTED.  The SARS-CoV-2 RNA is generally detectable in upper respiratory specimens during the acute phase of infection. The lowest concentration of SARS-CoV-2 viral copies this assay can detect is 138 copies/mL. A negative  result does not preclude SARS-Cov-2 infection and should not be used as the sole basis for treatment or other patient management decisions. A negative result may occur with  improper specimen collection/handling, submission of specimen other than nasopharyngeal swab, presence of viral mutation(s) within the areas targeted by this assay, and inadequate number of viral copies(<138 copies/mL). A negative result must be combined with clinical observations, patient history, and epidemiological information. The expected result is Negative.  Fact Sheet for Patients:  BloggerCourse.com  Fact Sheet for Healthcare Providers:  SeriousBroker.it  This test is no t yet approved or cleared by the [place] FDA and  has been authorized for detection and/or diagnosis of SARS-CoV-2 by FDA under an Emergency Use Authorization (EUA). This EUA will remain  in effect (meaning this  test can be used) for the duration of the COVID-19 declaration under Section 564(b)(1) of the Act, 21 U.S.C.section 360bbb-3(b)(1), unless the authorization is terminated  or revoked sooner.       Influenza A by PCR NEGATIVE NEGATIVE Final   Influenza B by PCR NEGATIVE NEGATIVE Final    Comment: (NOTE) The Xpert Xpress SARS-CoV-2/FLU/RSV plus assay is intended as an aid in the diagnosis of influenza from Nasopharyngeal swab specimens and should not be used as a sole basis for treatment. Nasal washings and aspirates are unacceptable for Xpert Xpress SARS-CoV-2/FLU/RSV testing.  Fact Sheet for Patients: BloggerCourse.com  Fact Sheet for Healthcare Providers: SeriousBroker.it  This test is not yet approved or cleared by the [place] FDA and has been authorized for detection and/or diagnosis of SARS-CoV-2 by FDA under an Emergency Use Authorization (EUA). This EUA will remain in effect (meaning this test can be used)  for the duration of the COVID-19 declaration under Section 564(b)(1) of the Act, 21 U.S.C. section 360bbb-3(b)(1), unless the authorization is terminated or revoked.  Performed at [facility], [address] [place], [place] [address]   Blood culture (routine x 2)     Status: None (Preliminary result)   Collection Time: 07/07/20  1:55 AM   Specimen: BLOOD  Result Value Ref Range Status   Specimen Description   Final    BLOOD RIGHT ANTECUBITAL Performed at [facility], [address], [place], [place] [address]    Special Requests   Final    BOTTLES DRAWN AEROBIC AND ANAEROBIC Blood Culture results may not be optimal due to an excessive volume of blood received in culture bottles Performed at [facility], [address] [place], [place] [address]    Culture   Final    NO GROWTH 1 DAY Performed at [facility], [address], [place], [place] [address]    Report Status PENDING  Incomplete  Culture, sputum-assessment     Status: None   Collection Time: 07/07/20  3:23 AM   Specimen: Sputum  Result Value Ref Range Status   Specimen Description SPU  Final   Special Requests NONE  Final   Sputum evaluation   Final    THIS SPECIMEN IS ACCEPTABLE FOR SPUTUM CULTURE Performed at [facility], [address] [place], [place] [address]    Report Status 07/07/2020 FINAL  Final  Culture, respiratory     Status: None (Preliminary result)   Collection Time: 07/07/20  3:23 AM   Specimen: Sputum  Result Value Ref Range Status   Specimen Description   Final    SPU Performed at [facility], [address] [place], [place] [address]    Special Requests   Final    NONE Reflexed from [id number] Performed at [facility], [address], [place], [place] [address]    Gram Stain   Final    ABUNDANT WBC PRESENT, PREDOMINANTLY PMN FEW GRAM POSITIVE COCCOBACILLUS     Culture   Final    CULTURE REINCUBATED FOR BETTER GROWTH Performed at [facility], [address] [place], [place] [address]    Report Status PENDING  Incomplete  Blood culture (routine x 2)     Status: None (Preliminary result)   Collection Time: 07/07/20 11:25 AM   Specimen: BLOOD RIGHT HAND  Result Value Ref Range Status   Specimen Description   Final    BLOOD RIGHT HAND Performed  at [facility], [address] [place], [place] [address]    Special Requests   Final    BOTTLES DRAWN AEROBIC AND ANAEROBIC Blood Culture adequate volume Performed at [facility], [address] [place], [place] [address]    Culture   Final    NO GROWTH < 24 HOURS Performed at [facility], [address] [place] [address] [place], [place] [address]    Report Status PENDING  Incomplete         Radiology Studies: DG Chest 2 View  Result Date: 07/06/2020 CLINICAL DATA:  Hypoxia cough EXAM: CHEST - 2 VIEW COMPARISON:  08/24/2013 FINDINGS: Patchy perihilar and basilar airspace opacities. No pleural effusion. Normal heart size. No pneumothorax IMPRESSION: Patchy perihilar and basilar airspace opacities suspicious for multifocal pneumonia, possible atypical or viral pneumonia. Electronically Signed   By: Jasmine PangKim  Fujinaga M.D.   On: 07/06/2020 23:19   CT Angio Chest PE W and/or Wo Contrast  Result Date: 07/07/2020 CLINICAL DATA:  Positive D-dimer shortness of breath EXAM: CT ANGIOGRAPHY CHEST WITH CONTRAST TECHNIQUE: Multidetector CT imaging of the chest was performed using the standard protocol during bolus administration of intravenous contrast. Multiplanar CT image reconstructions and MIPs were obtained to evaluate the vascular anatomy. CONTRAST:  100mL OMNIPAQUE IOHEXOL 350 MG/ML SOLN COMPARISON:  None. FINDINGS: Cardiovascular: There is suboptimal opacification of the main pulmonary artery, however no central pulmonary embolism is seen. The heart is normal in size.  No pericardial effusion or thickening. No evidence right heart strain. There is normal three-vessel brachiocephalic anatomy without proximal stenosis. The thoracic aorta is normal in appearance. Mediastinum/Nodes: Scattered subcarinal and right hilar lymph nodes are seen the largest measuring 1.3 cm within the right hilum. Thyroid gland, trachea, and esophagus demonstrate no significant findings. Lungs/Pleura: Multifocal patchy/reticulonodular airspace opacities are seen throughout both lungs predominantly within the posterior right lung base. There is also tortuous cystic area within the posterior left lung base which could represent a bronchocele. There does appear to be mucous plugging seen in the posterior bilateral bronchials. Upper Abdomen: No acute abnormalities present in the visualized portions of the upper abdomen. Musculoskeletal: No chest wall abnormality. No acute or significant osseous findings. Review of the MIP images confirms the above findings. IMPRESSION: Multifocal patchy airspace opacities throughout both lungs, right greater than left, likely consistent with multifocal pneumonia. Suboptimal opacification of the main pulmonary artery, however no central pulmonary embolism is seen. Electronically Signed   By: Jonna ClarkBindu  Avutu M.D.   On: 07/07/2020 01:23        Scheduled Meds:  buprenorphine-naloxone  1 tablet Sublingual TID   fluticasone  2 spray Each Nare Daily   guaiFENesin  600 mg Oral BID   ipratropium-albuterol  3 mL Nebulization Q4H   methylPREDNISolone (SOLU-MEDROL) injection  60 mg Intravenous Q24H   nicotine  21 mg Transdermal Daily   Continuous Infusions:  azithromycin 500 mg (07/08/20 0051)   cefTRIAXone (ROCEPHIN)  IV 2 g (07/08/20 0219)     LOS: 1 day   Time spent: 25mins Greater than 50% of this time was spent in counseling, explanation of diagnosis, planning of further management, and coordination of care.  I have personally reviewed and interpreted on   07/08/2020 daily labs, tele strips, imagings as discussed above under date review session and assessment and plans.  I reviewed all nursing notes, pharmacy notes,  vitals, pertinent old records  I have discussed plan of care as described above with RN , patient  on 07/08/2020  Voice Recognition /Dragon dictation system was used to create this note, attempts have been made to correct errors. Please contact the author with questions and/or clarifications.   Albertine Grates, MD PhD FACP [facility]  Available via Epic secure chat 7am-7pm for nonurgent issues Please page for urgent issues To page the attending provider between 7A-7P or the covering provider during after hours 7P-7A, please log into the web site [email or web address] and access using universal [place] password for that web site. If you do not have the password, please call the hospital operator.    07/08/2020, 12:54 PM

## 2020-07-09 LAB — BASIC METABOLIC PANEL
Anion gap: 10 (ref 5–15)
BUN: 13 mg/dL (ref 6–20)
CO2: 24 mmol/L (ref 22–32)
Calcium: 8.5 mg/dL — ABNORMAL LOW (ref 8.9–10.3)
Chloride: 104 mmol/L (ref 98–111)
Creatinine, Ser: 0.58 mg/dL — ABNORMAL LOW (ref 0.61–1.24)
GFR, Estimated: >60 mL/min (ref >60)
Glucose, Bld: 154 mg/dL — ABNORMAL HIGH (ref 70–99)
Potassium: 4.4 mmol/L (ref 3.5–5.1)
Sodium: 138 mmol/L (ref 135–145)

## 2020-07-09 LAB — CBC WITH DIFFERENTIAL/PLATELET
Abs Immature Granulocytes: 0.2 10*3/uL — ABNORMAL HIGH (ref 0.00–0.07)
Basophils Absolute: 0 10*3/uL (ref 0.0–0.1)
Basophils Relative: 0 %
Eosinophils Absolute: 0 10*3/uL (ref 0.0–0.5)
Eosinophils Relative: 0 %
HCT: 43.4 % (ref 39.0–52.0)
Hemoglobin: 14.6 g/dL (ref 13.0–17.0)
Immature Granulocytes: 1 %
Lymphocytes Relative: 8 %
Lymphs Abs: 1.4 10*3/uL (ref 0.7–4.0)
MCH: 31.8 pg (ref 26.0–34.0)
MCHC: 33.6 g/dL (ref 30.0–36.0)
MCV: 94.6 fL (ref 80.0–100.0)
Monocytes Absolute: 0.8 10*3/uL (ref 0.1–1.0)
Monocytes Relative: 4 %
Neutro Abs: 15.2 10*3/uL — ABNORMAL HIGH (ref 1.7–7.7)
Neutrophils Relative %: 87 %
Platelets: 237 10*3/uL (ref 150–400)
RBC: 4.59 MIL/uL (ref 4.22–5.81)
RDW: 11.8 % (ref 11.5–15.5)
WBC: 17.6 10*3/uL — ABNORMAL HIGH (ref 4.0–10.5)
nRBC: 0 % (ref 0.0–0.2)

## 2020-07-09 LAB — CULTURE, RESPIRATORY W GRAM STAIN

## 2020-07-09 LAB — PROCALCITONIN: Procalcitonin: 0.1 ng/mL

## 2020-07-09 LAB — LEGIONELLA PNEUMOPHILA SEROGP 1 UR AG: L. pneumophila Serogp 1 Ur Ag: NEGATIVE

## 2020-07-09 MED ORDER — AZITHROMYCIN 250 MG PO TABS: 500.0000 mg | ORAL_TABLET | Freq: Every day | ORAL | Status: DC

## 2020-07-09 NOTE — Progress Notes (Addendum)
PROGRESS NOTE    Roy Richmond  GQQ:[id number] DOB: 02/18/1986 DOA: 07/06/2020 PCP: Vivien Presto, MD    Chief Complaint  Patient presents with  . Respiratory Distress    Brief Narrative:  Roy Richmond is a 34 y.o. male with history of asthma and tobacco abuse presents to the ER with complaints of worsening shortness of breath.  Patient states over the last 4 days he started getting more short of breath with productive cough pleuritic type of chest pain.  And last 2 days he has been running subjective feeling of fever chills.  Phlegm becoming more discolored.  Was running fever as high has 103 F.  Patient became hypoxic with home oxygen saturation up to around 75% at this point patient decided to come to the ER.  Patient denies any recent travel sick contacts or any contact with animals or birds.  ED Course: In the ER patient was requiring 3 L oxygen to maintain sats more than 90% with D-dimer mildly elevated CT angiogram of the chest was done which shows multifocal pneumonia.  Labs are showing WBC count of 19.8.  Sodium 134.  Covid test was negative flu was negative.  Patient started on empiric antibiotics after blood cultures admitted for acute respiratory failure with hypoxia secondary to multifocal pneumonia.   Subjective:  Improving, less Short of breath, less per likely chest pain Continue to have productive cough with green sputum with occasional blood-tinged sputum   Assessment & Plan:   Principal Problem:   CAP (community acquired pneumonia) Active Problems:   Asthma   Acute respiratory failure with hypoxia (HCC)   Multifocal pneumonia   Acute hypoxic respiratory failure/bilateral pneumonia/Sever sepsis present on admission -per ED triage "Patient BIB EMS from home with c/o sob on exertion with productive cough of green sputum for 5 days, inhaler was not helping, 88% on RA." -He has fever, leukocytosis, sinus tachycardia, hypoxia on presentation, meet  severe sepsis criteria -CT chest personally reviewed with patient showed" bilateral multifocal pneumonia" -Blood culture no growth, sputum culture in process, urine strep positive for pneumo positive -Continue current antibiotics, follow-up sputum culture and sensitivity result , adjust antibiotic accordingly  -wean oxygen as able -Clinically he is slowly improving, discontinue telemetry  4pm addendum: sputum culture showed resistant strep pneumo, DC'd Zithromax, continue Rocephin ( sensitive)  History of asthma, every day smoker started onIV steroid,  nebulizer, Mucinex, continue antibiotics Less wheezing on exam   Elevated fasting blood glucose, no prior history of diabetes Possible stress and steroid induced  A1c 5.8   Chronic pain; on Suboxone film under the tongue in the morning and bedtime   Current every day smoker 1 pack a day Smoking cessation education provided, nicotine patch prescribed  Body mass index is 33.36 kg/m.   DVT prophylaxis: He declined Lovenox injection, will provide SCDs and encourage ambulation   Code Status: Full Family Communication: Patient Disposition:   Status is: Inpatient  Dispo: The patient is from: Home              Anticipated d/c is to: Home              Anticipated d/c date is: 24-48hrs pending  respiratory status              Patient currently is not medically ready to discharge  Consultants:   None  Procedures:   None  Antimicrobials:    Rocephin and Zithromax    Objective: Vitals:   07/08/20 1611  07/08/20 2008 07/09/20 0525 07/09/20 0803  BP:  (!) 143/107 (!) 153/101   Pulse:  80 79   Resp:  17 16   Temp:  98 F (36.7 C) 98 F (36.7 C)   TempSrc:  Oral Oral   SpO2: 93% 97% 96% 93%  Weight:      Height:        Intake/Output Summary (Last 24 hours) at 07/09/2020 0832 Last data filed at 07/08/2020 1034 Gross per 24 hour  Intake 240 ml  Output --  Net 240 ml   Filed Weights   07/06/20 2243  Weight:  111.6 kg    Examination:  General exam: Less anxious Respiratory system: Coarse breath sound, bilateral wheezing.  Not able to take deep breath due to pleuritic chest pain Cardiovascular system: Tachycardia has resolved , now RRR. No JVD, no murmur, No pedal edema. Gastrointestinal system: Abdomen is nondistended, soft and nontender.  Normal bowel sounds heard. Central nervous system: Alert and oriented. No focal neurological deficits. Extremities: Symmetric 5 x 5 power. Skin: No rashes, lesions or ulcers Psychiatry: Judgement and insight appear normal. Mood & affect appropriate.     Data Reviewed: I have personally reviewed following labs and imaging studies  CBC: Recent Labs  Lab 07/06/20 2303 07/07/20 0415 07/08/20 0423 07/09/20 0412  WBC 19.8* 22.0* 22.7* 17.6*  NEUTROABS 17.4* 20.5* 20.3* 15.2*  HGB 16.5 15.7 14.4 14.6  HCT 48.6 46.0 43.0 43.4  MCV 93.3 93.9 94.7 94.6  PLT 225 216 221 237    Basic Metabolic Panel: Recent Labs  Lab 07/06/20 2303 07/07/20 0415 07/08/20 0423 07/09/20 0412  NA 134* 137 141 138  K 4.1 4.0 4.2 4.4  CL 98 102 106 104  CO2 26 25 23 24   GLUCOSE 119* 259* 202* 154*  BUN 12 11 14 13   CREATININE 0.74 0.76 0.63 0.58*  CALCIUM 8.7* 8.5* 8.6* 8.5*  MG  --   --  2.3  --     GFR: Estimated Creatinine Clearance: 166.3 mL/min (A) (by C-G formula based on SCr of 0.58 mg/dL (L)).  Liver Function Tests: Recent Labs  Lab 07/07/20 0415  AST 18  ALT 30  ALKPHOS 65  BILITOT 0.9  PROT 7.0  ALBUMIN 3.6    CBG: No results for input(s): GLUCAP in the last 168 hours.   Recent Results (from the past 240 hour(s))  Resp Panel by RT-PCR (Flu A&B, Covid) Nasopharyngeal Swab     Status: None   Collection Time: 07/06/20 11:04 PM   Specimen: Nasopharyngeal Swab; Nasopharyngeal(NP) swabs in vial transport medium  Result Value Ref Range Status   SARS Coronavirus 2 by RT PCR NEGATIVE NEGATIVE Final    Comment: (NOTE) SARS-CoV-2 target nucleic  acids are NOT DETECTED.  The SARS-CoV-2 RNA is generally detectable in upper respiratory specimens during the acute phase of infection. The lowest concentration of SARS-CoV-2 viral copies this assay can detect is 138 copies/mL. A negative result does not preclude SARS-Cov-2 infection and should not be used as the sole basis for treatment or other patient management decisions. A negative result may occur with  improper specimen collection/handling, submission of specimen other than nasopharyngeal swab, presence of viral mutation(s) within the areas targeted by this assay, and inadequate number of viral copies(<138 copies/mL). A negative result must be combined with clinical observations, patient history, and epidemiological information. The expected result is Negative.  Fact Sheet for Patients:  14/10/21  Fact Sheet for Healthcare Providers:  14/09/21  This test  is no t yet approved or cleared by the [place] FDA and  has been authorized for detection and/or diagnosis of SARS-CoV-2 by FDA under an Emergency Use Authorization (EUA). This EUA will remain  in effect (meaning this test can be used) for the duration of the COVID-19 declaration under Section 564(b)(1) of the Act, 21 U.S.C.section 360bbb-3(b)(1), unless the authorization is terminated  or revoked sooner.       Influenza A by PCR NEGATIVE NEGATIVE Final   Influenza B by PCR NEGATIVE NEGATIVE Final    Comment: (NOTE) The Xpert Xpress SARS-CoV-2/FLU/RSV plus assay is intended as an aid in the diagnosis of influenza from Nasopharyngeal swab specimens and should not be used as a sole basis for treatment. Nasal washings and aspirates are unacceptable for Xpert Xpress SARS-CoV-2/FLU/RSV testing.  Fact Sheet for Patients: BloggerCourse.comhttps://www.fda.gov/media/152166/download  Fact Sheet for Healthcare Providers: SeriousBroker.ithttps://www.fda.gov/media/152162/download  This  test is not yet approved or cleared by the [place] FDA and has been authorized for detection and/or diagnosis of SARS-CoV-2 by FDA under an Emergency Use Authorization (EUA). This EUA will remain in effect (meaning this test can be used) for the duration of the COVID-19 declaration under Section 564(b)(1) of the Act, 21 U.S.C. section 360bbb-3(b)(1), unless the authorization is terminated or revoked.  Performed at [facility], [address] [place], [place] [address]   Blood culture (routine x 2)     Status: None (Preliminary result)   Collection Time: 07/07/20  1:55 AM   Specimen: BLOOD  Result Value Ref Range Status   Specimen Description   Final    BLOOD RIGHT ANTECUBITAL Performed at [facility], [address] [place], [place] [address]    Special Requests   Final    BOTTLES DRAWN AEROBIC AND ANAEROBIC Blood Culture results may not be optimal due to an excessive volume of blood received in culture bottles Performed at [facility], [address] [place], [place] [address]    Culture   Final    NO GROWTH 1 DAY Performed at [facility], [address] [place], [place] [address]    Report Status PENDING  Incomplete  Culture, sputum-assessment     Status: None   Collection Time: 07/07/20  3:23 AM   Specimen: Sputum  Result Value Ref Range Status   Specimen Description SPU  Final   Special Requests NONE  Final   Sputum evaluation   Final    THIS SPECIMEN IS ACCEPTABLE FOR SPUTUM CULTURE Performed at [facility], [address] [place], [place] [address]    Report Status 07/07/2020 FINAL  Final  Culture, respiratory     Status: None (Preliminary result)   Collection Time: 07/07/20  3:23 AM   Specimen: Sputum  Result Value Ref Range Status   Specimen Description   Final    SPU Performed at [facility], [address] [place], [place] [address]    Special  Requests   Final    NONE Reflexed from [id number] Performed at [facility], [address] [place], [place] [address]    Gram Stain   Final    ABUNDANT WBC PRESENT, PREDOMINANTLY PMN FEW GRAM POSITIVE COCCOBACILLUS    Culture   Final    MODERATE STREPTOCOCCUS PNEUMONIAE SUSCEPTIBILITIES TO FOLLOW Performed at [facility], [address] [place], [place] [address]    Report Status PENDING  Incomplete  Blood culture (routine x 2)  Status: None (Preliminary result)   Collection Time: 07/07/20 11:25 AM   Specimen: BLOOD RIGHT HAND  Result Value Ref Range Status   Specimen Description   Final    BLOOD RIGHT HAND Performed at [facility], [address], [place], [place] [address]    Special Requests   Final    BOTTLES DRAWN AEROBIC AND ANAEROBIC Blood Culture adequate volume Performed at [facility], [address], [place], [place] [address]    Culture   Final    NO GROWTH 1 DAY Performed at [facility], [address], [place], [place] [address]    Report Status PENDING  Incomplete         Radiology Studies: No results found.      Scheduled Meds: . buprenorphine-naloxone  1 tablet Sublingual TID  . fluticasone  2 spray Each Nare Daily  . guaiFENesin  600 mg Oral BID  . ipratropium-albuterol  3 mL Nebulization TID  . methylPREDNISolone (SOLU-MEDROL) injection  60 mg Intravenous Q24H  . nicotine  21 mg Transdermal Daily   Continuous Infusions: . azithromycin 500 mg (07/09/20 0215)  . cefTRIAXone (ROCEPHIN)  IV 2 g (07/09/20 0122)     LOS: 2 days   Time spent: Greater than 50% of this time was spent in counseling, explanation of diagnosis, planning of further management, and coordination of care.  I have personally reviewed and interpreted on  07/09/2020 daily labs, tele strips,  I reviewed all nursing notes, pharmacy notes,  vitals, pertinent old records  I have discussed  plan of care as described above with RN , patient  on 07/09/2020  Voice Recognition /Dragon dictation system was used to create this note, attempts have been made to correct errors. Please contact the author with questions and/or clarifications.   Albertine Grates, MD PhD FACP [facility]  Available via Epic secure chat 7am-7pm for nonurgent issues Please page for urgent issues To page the attending provider between 7A-7P or the covering provider during after hours 7P-7A, please log into the web site [email or web address] and access using universal [place] password for that web site. If you do not have the password, please call the hospital operator.    07/09/2020, 8:32 AM

## 2020-07-09 NOTE — Progress Notes (Signed)
Pharmacy IV to PO conversion  This patient is receiving Azithromycin by the intravenous route. Based on criteria approved by the Pharmacy and Therapeutics Committee, and the Infectious Disease Division, the antibiotic(s) is/are being converted to equivalent oral dose form(s). These criteria include:   Patient being treated for a respiratory tract infection, urinary tract infection, cellulitis, or Clostridium Difficile Associated Diarrhea  The patient is not neutropenic and does not exhibit a GI malabsorption state  The patient is eating (either orally or per tube) and/or has been taking other orally administered medications for at least 24 hours.  The patient is improving clinically (physician assessment and a 24-hour Tmax of <=100.5 F)  If you have any questions about this conversion, please contact the Pharmacy Department (ext [phone]).  Thank you.  Bernadene Person, PharmD, BCPS [phone] 07/09/2020, 1:44 PM

## 2020-07-10 LAB — CBC WITH DIFFERENTIAL/PLATELET
Abs Immature Granulocytes: 0.31 10*3/uL — ABNORMAL HIGH (ref 0.00–0.07)
Basophils Absolute: 0 10*3/uL (ref 0.0–0.1)
Basophils Relative: 0 %
Eosinophils Absolute: 0 10*3/uL (ref 0.0–0.5)
Eosinophils Relative: 0 %
HCT: 46.2 % (ref 39.0–52.0)
Hemoglobin: 15.8 g/dL (ref 13.0–17.0)
Immature Granulocytes: 2 %
Lymphocytes Relative: 10 %
Lymphs Abs: 1.3 10*3/uL (ref 0.7–4.0)
MCH: 31.7 pg (ref 26.0–34.0)
MCHC: 34.2 g/dL (ref 30.0–36.0)
MCV: 92.8 fL (ref 80.0–100.0)
Monocytes Absolute: 0.7 10*3/uL (ref 0.1–1.0)
Monocytes Relative: 5 %
Neutro Abs: 10.6 10*3/uL — ABNORMAL HIGH (ref 1.7–7.7)
Neutrophils Relative %: 83 %
Platelets: 277 10*3/uL (ref 150–400)
RBC: 4.98 MIL/uL (ref 4.22–5.81)
RDW: 11.7 % (ref 11.5–15.5)
WBC: 13 10*3/uL — ABNORMAL HIGH (ref 4.0–10.5)
nRBC: 0 % (ref 0.0–0.2)

## 2020-07-10 LAB — BASIC METABOLIC PANEL
Anion gap: 11 (ref 5–15)
BUN: 15 mg/dL (ref 6–20)
CO2: 25 mmol/L (ref 22–32)
Calcium: 8.8 mg/dL — ABNORMAL LOW (ref 8.9–10.3)
Chloride: 101 mmol/L (ref 98–111)
Creatinine, Ser: 0.65 mg/dL (ref 0.61–1.24)
GFR, Estimated: >60 mL/min (ref >60)
Glucose, Bld: 165 mg/dL — ABNORMAL HIGH (ref 70–99)
Potassium: 4.5 mmol/L (ref 3.5–5.1)
Sodium: 137 mmol/L (ref 135–145)

## 2020-07-10 LAB — PROCALCITONIN: Procalcitonin: <0.1 ng/mL

## 2020-07-10 MED ORDER — CEFDINIR 300 MG PO CAPS: 300.0000 mg | ORAL_CAPSULE | Freq: Two times a day (BID) | ORAL | 0 refills | Status: AC

## 2020-07-10 MED ORDER — AMLODIPINE BESYLATE 5 MG PO TABS: 5.0000 mg | ORAL_TABLET | Freq: Every day | ORAL | 1 refills | Status: AC

## 2020-07-10 MED ORDER — GUAIFENESIN 100 MG/5ML PO SOLN: 5.0000 mL | ORAL | 0 refills | Status: DC | PRN

## 2020-07-10 NOTE — Plan of Care (Signed)
  Problem: Clinical Measurements: Goal: Ability to maintain clinical measurements within normal limits will improve Outcome: Adequate for Discharge Goal: Diagnostic test results will improve Outcome: Adequate for Discharge Goal: Respiratory complications will improve Outcome: Adequate for Discharge   Problem: Activity: Goal: Risk for activity intolerance will decrease Outcome: Adequate for Discharge   Problem: Pain Managment: Goal: General experience of comfort will improve Outcome: Adequate for Discharge

## 2020-07-10 NOTE — Discharge Instructions (Signed)
Community-Acquired Pneumonia, Adult Pneumonia is a type of lung infection that causes swelling in the airways of the lungs. Mucus and fluid may also build up inside the airways. This may cause coughing and difficulty breathing. There are different types of pneumonia. One type can develop while a person is in a hospital. A different type is called community-acquired pneumonia. It develops in people who are not, and have not recently been, in the hospital or another type of health care facility. What are the causes? This condition may be caused by:  Viruses. This is the most common cause of pneumonia.  Bacteria. Community-acquired pneumonia is often caused by Streptococcus pneumoniae bacteria. These bacteria are often passed from one person to another by breathing in droplets from the cough or sneeze of an infected person.  Fungi. This is the least common cause of pneumonia. What increases the risk? The following factors may make you more likely to develop this condition:  Having a chronic disease, such as chronic obstructive pulmonary disease (COPD), asthma, congestive heart failure, cystic fibrosis, diabetes, or kidney disease.  Having early-stage or late-stage HIV.  Having sickle cell disease.  Having had your spleen removed (splenectomy).  Having poor dental hygiene.  Having a medical condition that increases the risk of breathing in (aspirating) secretions from your own mouth and nose.  Having a weakened body defense system (immune system).  Being a smoker.  Traveling to areas where pneumonia-causing germs commonly exist.  Being around animal habitats or animals that have pneumonia-causing germs, including birds, bats, rabbits, cats, and farm animals. What are the signs or symptoms? Symptoms of this condition include:  A dry cough.  A wet (productive) cough.  Fever.  Sweating.  Chest pain, especially when breathing deeply or coughing.  Rapid breathing or difficulty  breathing.  Shortness of breath.  Shaking chills.  Fatigue.  Muscle aches. How is this diagnosed? This condition may be diagnosed based on:  Your medical history.  A physical exam. You may also have tests, including:  Chest X-rays.  Tests of your blood oxygen level and other blood gases.  Tests on blood, mucus (sputum), fluid around your lungs (pleural fluid), and urine. If your pneumonia is severe, other tests may be done to find the exact cause of your illness. How is this treated? Treatment for this condition depends on many factors, such as the cause of your pneumonia, the medicines you take, and other medical conditions that you have. For most adults, treatment and recovery from pneumonia may occur at home. In some cases, treatment must happen in a hospital. Treatment may include:  Medicines that are given by mouth or through an IV, including: ? Antibiotic medicines, if the pneumonia was caused by bacteria. ? Antiviral medicines, if the pneumonia was caused by a virus.  Being given extra oxygen.  Respiratory therapy. Although rare, treating severe pneumonia may include:  Using a machine to help you breathe (mechanical ventilation). This is done if you are not breathing well on your own and you cannot maintain a safe blood oxygen level.  Thoracentesis. This is a procedure to remove fluid from around one lung or both lungs to help you breathe better. Follow these instructions at home:  Medicines  Take over-the-counter and prescription medicines only as told by your health care provider. ? Only take cough medicine if you are losing sleep. Be aware that cough medicine can prevent your body's natural ability to remove mucus from your lungs.  If you were prescribed an antibiotic   medicine, take it as told by your health care provider. Do not stop taking the antibiotic even if you start to feel better. General instructions  Sleep in a semi-upright position at night. Try  sleeping in a reclining chair, or place a few pillows under your head.  Rest as needed and get at least 8 hours of sleep each night.  Drink enough water to keep your urine pale yellow. This will help to thin out mucus secretions in your lungs.  Eat a healthy diet that includes plenty of vegetables, fruits, whole grains, low-fat dairy products, and lean protein.  Do not use any products that contain nicotine or tobacco, such as cigarettes, e-cigarettes, and chewing tobacco. If you need help quitting, ask your health care provider.  Keep all follow-up visits as told by your health care provider. This is important. How is this prevented? You can lower your risk of developing community-acquired pneumonia by:  Getting a pneumococcal vaccine. There are different types and schedules of pneumococcal vaccines. Ask your health care provider which option is best for you. Consider getting the vaccine if: ? You are older than 35 years of age. ? You are older than 35 years of age and are undergoing cancer treatment, have chronic lung disease, or have other medical conditions that affect your immune system. Ask your health care provider if this applies to you.  Getting an influenza vaccine every year. Ask your health care provider which type of vaccine is best for you.  Getting regular checkups from your dentist.  Washing your hands often. If soap and water are not available, use hand sanitizer. Contact a health care provider if:  You have a fever.  You are losing sleep because you cannot control your cough with cough medicine. Get help right away if:  You have worsening shortness of breath.  You have increased chest pain.  Your sickness becomes worse, especially if you are an older adult or have a weakened immune system.  You cough up blood. Summary  Pneumonia is an infection of the lungs.  Community-acquired pneumonia develops in people who have not been in the hospital. It can be caused  by bacteria, viruses, or fungi.  This condition may be treated with antibiotics or antiviral medicines.  Severe cases may require hospitalization, mechanical ventilation, and other procedures to drain fluid from the lungs. This information is not intended to replace advice given to you by your health care provider. Make sure you discuss any questions you have with your health care provider. Document Revised: 03/12/2018 Document Reviewed: 03/12/2018 Elsevier Patient Education  2020 [employer]  

## 2020-07-10 NOTE — Discharge Summary (Signed)
Physician Discharge Summary  COUGAR IMEL BZJ:[id number] DOB: 30-Apr-1986 DOA: 07/06/2020  PCP: Vivien Presto, MD  Admit date: 07/06/2020 Discharge date: 07/10/2020  Admitted From: Home Disposition:  Home  Discharge Condition:Stable CODE STATUS:FULL Diet recommendation: Heart Healthy    Brief/Interim Summary: Roy Richmond a 34 y.o.malewithhistory of asthma and tobacco abuse presents to the ER with complaints of worsening shortness of breath. Patient states over the last 4 days he started getting more short of breath with productive cough pleuritic type of chest pain. And last 2 days he has been running subjective feeling of fever chills. Phlegm becoming more discolored. Was running fever as high has 103 F. Patient became hypoxic with home oxygen saturation up to around 75% at this point patient decided to come to the ER. Patient denies any recent travel sick contacts or any contact with animals or birds.  ED Course:In the ER patient was requiring 3 L oxygen to maintain sats more than 90% with D-dimer mildly elevated CT angiogram of the chest was done which shows multifocal pneumonia. Labs are showing WBC count of 19.8. Sodium 134. Covid test was negative flu was negative. Patient started on empiric antibiotics after blood cultures admitted for acute respiratory failure with hypoxia secondary to multifocal pneumonia.  Hospital course:  His hospital course remained stable.  He was treated with broad-spectrum antibiotics.  Respiratory status is stable and is currently saturating fine on room air.  He is stable for discharge home with oral antibiotics.  Patient has been advised to stop smoking.  Following problems were addressed during hospitalization:  Acute hypoxic respiratory failure/bilateral pneumonia/Sever sepsis present on admission -per ED triage "Patient BIB EMS from home with c/o sob on exertion with productive cough of green sputum for 5 days, inhaler was  not helping, 88% on RA." -He had fever, leukocytosis, sinus tachycardia, hypoxia on presentation, meet severe sepsis criteria -CT chest personally reviewed with patient showed" bilateral multifocal pneumonia" -Blood culture no growth, sputum culture in process, urine strep positive for pneumo positive -Currently saturating fine on room air.  Respiratory status stable.  Stable  for discharge with oral antibiotics. -Sputum culture showed Streptococcus pneumoniae  History of asthma, every day smoker treated with IV steroid,  nebulizer, Mucinex, continue antibiotics Free of wheezes today  Elevated fasting blood glucose, no prior history of diabetes Possible stress and steroid induced  A1c 5.8  Chronic pain  on Suboxone film under the tongue in the morning and bedtime  Current every day smoker 1 pack a day Smoking cessation education provided  Hypertension Noted to be hypertensive consistently.  Started on 5 mg of amlodipine.  Body mass index is 33.36 kg/m.   Discharge Diagnoses:  Principal Problem:   CAP (community acquired pneumonia) Active Problems:   Asthma   Acute respiratory failure with hypoxia (HCC)   Multifocal pneumonia    Discharge Instructions  Discharge Instructions    Diet - low sodium heart healthy   Complete by: As directed    Discharge instructions   Complete by: As directed    1)Please take prescribed medications as instructed 2) monitor your blood pressure at home 3)Follow up with your PCP in 2 weeks. 4)Stop smoking   Increase activity slowly   Complete by: As directed      Allergies as of 07/10/2020      Reactions   Atenolol Other (See Comments)   Headache   Tramadol Other (See Comments)   Headache      Medication List  TAKE these medications   albuterol 108 (90 Base) MCG/ACT inhaler Commonly known as: VENTOLIN HFA Inhale 2 puffs into the lungs every 6 (six) hours as needed for wheezing.   amLODipine 5 MG tablet Commonly  known as: NORVASC Take 1 tablet (5 mg total) by mouth daily.   budesonide-formoterol 160-4.5 MCG/ACT inhaler Commonly known as: Symbicort Inhale 2 puffs into the lungs 2 (two) times daily.   Buprenorphine HCl-Naloxone HCl 8-2 MG Film Place 1 Film under the tongue in the morning, at noon, and at bedtime.   cefdinir 300 MG capsule Commonly known as: OMNICEF Take 1 capsule (300 mg total) by mouth 2 (two) times daily for 3 days.   cetirizine-pseudoephedrine 5-120 MG tablet Commonly known as: ZYRTEC-D Take 1 tablet by mouth 2 (two) times daily.   guaiFENesin 100 MG/5ML Soln Commonly known as: ROBITUSSIN Take 5 mLs (100 mg total) by mouth every 4 (four) hours as needed for cough or to loosen phlegm.   naproxen sodium 220 MG tablet Commonly known as: ALEVE Take 220 mg by mouth 2 (two) times daily as needed (pain).       Follow-up Information    Corrington, Kip A, MD. Schedule an appointment as soon as possible for a visit in 1 week(s).   Specialty: Family Medicine Contact information: [address] [place] [address] [phone]              Allergies  Allergen Reactions  . Atenolol Other (See Comments)    Headache   . Tramadol Other (See Comments)    Headache     Consultations:  None   Procedures/Studies: DG Chest 2 View  Result Date: 07/06/2020 CLINICAL DATA:  Hypoxia cough EXAM: CHEST - 2 VIEW COMPARISON:  08/24/2013 FINDINGS: Patchy perihilar and basilar airspace opacities. No pleural effusion. Normal heart size. No pneumothorax IMPRESSION: Patchy perihilar and basilar airspace opacities suspicious for multifocal pneumonia, possible atypical or viral pneumonia. Electronically Signed   By: Jasmine Pang M.D.   On: 07/06/2020 23:19   CT Angio Chest PE W and/or Wo Contrast  Result Date: 07/07/2020 CLINICAL DATA:  Positive D-dimer shortness of breath EXAM: CT ANGIOGRAPHY CHEST WITH CONTRAST TECHNIQUE: Multidetector CT imaging of the chest was  performed using the standard protocol during bolus administration of intravenous contrast. Multiplanar CT image reconstructions and MIPs were obtained to evaluate the vascular anatomy. CONTRAST:  OMNIPAQUE IOHEXOL 350 MG/ML SOLN COMPARISON:  None. FINDINGS: Cardiovascular: There is suboptimal opacification of the main pulmonary artery, however no central pulmonary embolism is seen. The heart is normal in size. No pericardial effusion or thickening. No evidence right heart strain. There is normal three-vessel brachiocephalic anatomy without proximal stenosis. The thoracic aorta is normal in appearance. Mediastinum/Nodes: Scattered subcarinal and right hilar lymph nodes are seen the largest measuring 1.3 cm within the right hilum. Thyroid gland, trachea, and esophagus demonstrate no significant findings. Lungs/Pleura: Multifocal patchy/reticulonodular airspace opacities are seen throughout both lungs predominantly within the posterior right lung base. There is also tortuous cystic area within the posterior left lung base which could represent a bronchocele. There does appear to be mucous plugging seen in the posterior bilateral bronchials. Upper Abdomen: No acute abnormalities present in the visualized portions of the upper abdomen. Musculoskeletal: No chest wall abnormality. No acute or significant osseous findings. Review of the MIP images confirms the above findings. IMPRESSION: Multifocal patchy airspace opacities throughout both lungs, right greater than left, likely consistent with multifocal pneumonia. Suboptimal opacification of the  main pulmonary artery, however no central pulmonary embolism is seen. Electronically Signed   By: Jonna ClarkBindu  Avutu M.D.   On: 07/07/2020 01:23       Subjective: Patient seen and examined at the bedside this morning.  Hemodynamically stable for discharge today  Discharge Exam: Vitals:   07/09/20 2023 07/10/20 0429  BP: (!) 143/94 (!) 159/104  Pulse: 77 65  Resp: 15  18  Temp: 98 F (36.7 C) 98.5 F (36.9 C)  SpO2: 96% 98%   Vitals:   07/09/20 1435 07/09/20 1458 07/09/20 2023 07/10/20 0429  BP: (!) 153/105  (!) 143/94 (!) 159/104  Pulse: 72  77 65  Resp: 18  15 18   Temp: 98 F (36.7 C)  98 F (36.7 C) 98.5 F (36.9 C)  TempSrc:   Oral Oral  SpO2: 96% 92% 96% 98%  Weight:      Height:        General: Pt is alert, awake, not in acute distress Cardiovascular: RRR, S1/S2 +, no rubs, no gallops Respiratory: CTA bilaterally, no wheezing, no rhonchi Abdominal: Soft, NT, ND, bowel sounds + Extremities: no edema, no cyanosis    The results of significant diagnostics from this hospitalization (including imaging, microbiology, ancillary and laboratory) are listed below for reference.     Microbiology: Recent Results (from the past 240 hour(s))  Resp Panel by RT-PCR (Flu A&B, Covid) Nasopharyngeal Swab     Status: None   Collection Time: 07/06/20 11:04 PM   Specimen: Nasopharyngeal Swab; Nasopharyngeal(NP) swabs in vial transport medium  Result Value Ref Range Status   SARS Coronavirus 2 by RT PCR NEGATIVE NEGATIVE Final    Comment: (NOTE) SARS-CoV-2 target nucleic acids are NOT DETECTED.  The SARS-CoV-2 RNA is generally detectable in upper respiratory specimens during the acute phase of infection. The lowest concentration of SARS-CoV-2 viral copies this assay can detect is 138 copies/mL. A negative result does not preclude SARS-Cov-2 infection and should not be used as the sole basis for treatment or other patient management decisions. A negative result may occur with  improper specimen collection/handling, submission of specimen other than nasopharyngeal swab, presence of viral mutation(s) within the areas targeted by this assay, and inadequate number of viral copies(<138 copies/mL). A negative result must be combined with clinical observations, patient history, and epidemiological information. The expected result is Negative.  Fact  Sheet for Patients:  BloggerCourse.comhttps://www.fda.gov/media/152166/download  Fact Sheet for Healthcare Providers:  SeriousBroker.ithttps://www.fda.gov/media/152162/download  This test is no t yet approved or cleared by the [place] FDA and  has been authorized for detection and/or diagnosis of SARS-CoV-2 by FDA under an Emergency Use Authorization (EUA). This EUA will remain  in effect (meaning this test can be used) for the duration of the COVID-19 declaration under Section 564(b)(1) of the Act, 21 U.S.C.section 360bbb-3(b)(1), unless the authorization is terminated  or revoked sooner.       Influenza A by PCR NEGATIVE NEGATIVE Final   Influenza B by PCR NEGATIVE NEGATIVE Final    Comment: (NOTE) The Xpert Xpress SARS-CoV-2/FLU/RSV plus assay is intended as an aid in the diagnosis of influenza from Nasopharyngeal swab specimens and should not be used as a sole basis for treatment. Nasal washings and aspirates are unacceptable for Xpert Xpress SARS-CoV-2/FLU/RSV testing.  Fact Sheet for Patients: BloggerCourse.comhttps://www.fda.gov/media/152166/download  Fact Sheet for Healthcare Providers: SeriousBroker.ithttps://www.fda.gov/media/152162/download  This test is not yet approved or cleared by the [place] FDA and has been authorized for detection and/or diagnosis of SARS-CoV-2 by FDA under  an Emergency Use Authorization (EUA). This EUA will remain in effect (meaning this test can be used) for the duration of the COVID-19 declaration under Section 564(b)(1) of the Act, 21 U.S.C. section 360bbb-3(b)(1), unless the authorization is terminated or revoked.  Performed at [facility], [address], [place], [place] [address]   Blood culture (routine x 2)     Status: None (Preliminary result)   Collection Time: 07/07/20  1:55 AM   Specimen: BLOOD  Result Value Ref Range Status   Specimen Description   Final    BLOOD RIGHT ANTECUBITAL Performed at [facility], [address]  [place], [place] [address]    Special Requests   Final    BOTTLES DRAWN AEROBIC AND ANAEROBIC Blood Culture results may not be optimal due to an excessive volume of blood received in culture bottles Performed at [facility], [address], [place], [place] [address]    Culture   Final    NO GROWTH 2 DAYS Performed at [facility], [address] [place], [place] [address]    Report Status PENDING  Incomplete  Culture, sputum-assessment     Status: None   Collection Time: 07/07/20  3:23 AM   Specimen: Sputum  Result Value Ref Range Status   Specimen Description SPU  Final   Special Requests NONE  Final   Sputum evaluation   Final    THIS SPECIMEN IS ACCEPTABLE FOR SPUTUM CULTURE Performed at [facility], [address], [place], [place] [address]    Report Status 07/07/2020 FINAL  Final  Culture, respiratory     Status: None   Collection Time: 07/07/20  3:23 AM   Specimen: Sputum  Result Value Ref Range Status   Specimen Description   Final    SPU Performed at [facility], [address], [place], [place] [address]    Special Requests   Final    NONE Reflexed from [id number] Performed at [facility], [address], [place], [place] [address]    Gram Stain   Final    ABUNDANT WBC PRESENT, PREDOMINANTLY PMN FEW GRAM POSITIVE COCCOBACILLUS Performed at [facility], [address], [place], [place] [address]    Culture MODERATE STREPTOCOCCUS PNEUMONIAE  Final   Report Status 07/09/2020 FINAL  Final   Organism ID, Bacteria STREPTOCOCCUS PNEUMONIAE  Final      Susceptibility   Streptococcus pneumoniae - MIC*    ERYTHROMYCIN >=8 RESISTANT Resistant     LEVOFLOXACIN 0.5 SENSITIVE Sensitive     VANCOMYCIN 0.5 SENSITIVE Sensitive     PENICILLIN (meningitis) 1 RESISTANT Resistant     PENO - penicillin 1      PENICILLIN (non-meningitis) 1 SENSITIVE Sensitive     PENICILLIN (oral) 1 INTERMEDIATE  Intermediate     CEFTRIAXONE (non-meningitis) <=0.12 SENSITIVE Sensitive     CEFTRIAXONE (meningitis) <=0.12 SENSITIVE Sensitive     * MODERATE STREPTOCOCCUS PNEUMONIAE  Blood culture (routine x 2)     Status: None (Preliminary result)   Collection Time: 07/07/20 11:25 AM   Specimen: BLOOD RIGHT HAND  Result Value Ref Range Status   Specimen Description   Final    BLOOD RIGHT HAND Performed at [facility], [address], [place], [place] [address]    Special Requests   Final    BOTTLES DRAWN AEROBIC AND ANAEROBIC Blood Culture adequate volume Performed at [facility], [address], [place], [place] [address]    Culture   Final    NO GROWTH 2 DAYS Performed at [facility], [address] [place], [place] [address]    Report Status PENDING  Incomplete     Labs: BNP (last 3 results) No results for input(s): BNP in the last 8760 hours. Basic Metabolic Panel: Recent Labs  Lab 07/06/20 2303 07/07/20 0415 07/08/20 0423 07/09/20 0412 07/10/20 0335  NA 134* 137 141 138 137  K 4.1 4.0 4.2 4.4 4.5  CL 98 102 106 104 101  CO2 26 25 23 24 25   GLUCOSE 119* 259* 202* 154* 165*  BUN 12 11 14 13 15   CREATININE 0.74 0.76 0.63 0.58* 0.65  CALCIUM 8.7* 8.5* 8.6* 8.5* 8.8*  MG  --   --  2.3  --   --    Liver Function Tests: Recent Labs  Lab 07/07/20 0415  AST 18  ALT 30  ALKPHOS 65  BILITOT 0.9  PROT 7.0  ALBUMIN 3.6   No results for input(s): LIPASE, AMYLASE in the last 168 hours. No results for input(s): AMMONIA in the last 168 hours. CBC: Recent Labs  Lab 07/06/20 2303 07/07/20 0415 07/08/20 0423 07/09/20 0412 07/10/20 0335  WBC 19.8* 22.0* 22.7* 17.6* 13.0*  NEUTROABS 17.4* 20.5* 20.3* 15.2* 10.6*  HGB 16.5 15.7 14.4 14.6 15.8  HCT 48.6 46.0 43.0 43.4 46.2  MCV 93.3 93.9 94.7 94.6 92.8  PLT 225 216 221 237 277   Cardiac Enzymes: No results for input(s): CKTOTAL, CKMB, CKMBINDEX, TROPONINI in the last 168  hours. BNP: Invalid input(s): POCBNP CBG: No results for input(s): GLUCAP in the last 168 hours. D-Dimer No results for input(s): DDIMER in the last 72 hours. Hgb A1c Recent Labs    07/08/20 0423  HGBA1C 5.8*   Lipid Profile No results for input(s): CHOL, HDL, LDLCALC, TRIG, CHOLHDL, LDLDIRECT in the last 72 hours. Thyroid function studies Recent Labs    07/08/20 0423  TSH 1.700   Anemia work up No results for input(s): VITAMINB12, FOLATE, FERRITIN, TIBC, IRON, RETICCTPCT in the last 72 hours. Urinalysis No results found for: COLORURINE, APPEARANCEUR, LABSPEC, PHURINE, GLUCOSEU, HGBUR, BILIRUBINUR, KETONESUR, PROTEINUR, UROBILINOGEN, NITRITE, LEUKOCYTESUR Sepsis Labs Invalid input(s): PROCALCITONIN,  WBC,  LACTICIDVEN Microbiology Recent Results (from the past 240 hour(s))  Resp Panel by RT-PCR (Flu A&B, Covid) Nasopharyngeal Swab     Status: None   Collection Time: 07/06/20 11:04 PM   Specimen: Nasopharyngeal Swab; Nasopharyngeal(NP) swabs in vial transport medium  Result Value Ref Range Status   SARS Coronavirus 2 by RT PCR NEGATIVE NEGATIVE Final    Comment: (NOTE) SARS-CoV-2 target nucleic acids are NOT DETECTED.  The SARS-CoV-2 RNA is generally detectable in upper respiratory specimens during the acute phase of infection. The lowest concentration of SARS-CoV-2 viral copies this assay can detect is 138 copies/mL. A negative result does not preclude SARS-Cov-2 infection and should not be used as the sole basis for treatment or other patient management decisions. A negative result may occur with  improper specimen collection/handling, submission of specimen other than nasopharyngeal swab, presence of viral mutation(s) within the areas targeted by this assay, and inadequate number of viral copies(<138 copies/mL). A negative result must be combined with clinical observations, patient history, and epidemiological information. The expected result is Negative.  Fact  Sheet for Patients:  BloggerCourse.com  Fact Sheet for Healthcare Providers:  SeriousBroker.it  This test is no t yet approved or cleared by the [place]  FDA and  has been authorized for detection and/or diagnosis of SARS-CoV-2 by FDA under an Emergency Use Authorization (EUA). This EUA will remain  in effect (meaning this test can be used) for the duration of the COVID-19 declaration under Section 564(b)(1) of the Act, 21 U.S.C.section 360bbb-3(b)(1), unless the authorization is terminated  or revoked sooner.       Influenza A by PCR NEGATIVE NEGATIVE Final   Influenza B by PCR NEGATIVE NEGATIVE Final    Comment: (NOTE) The Xpert Xpress SARS-CoV-2/FLU/RSV plus assay is intended as an aid in the diagnosis of influenza from Nasopharyngeal swab specimens and should not be used as a sole basis for treatment. Nasal washings and aspirates are unacceptable for Xpert Xpress SARS-CoV-2/FLU/RSV testing.  Fact Sheet for Patients: BloggerCourse.com  Fact Sheet for Healthcare Providers: SeriousBroker.it  This test is not yet approved or cleared by the [place] FDA and has been authorized for detection and/or diagnosis of SARS-CoV-2 by FDA under an Emergency Use Authorization (EUA). This EUA will remain in effect (meaning this test can be used) for the duration of the COVID-19 declaration under Section 564(b)(1) of the Act, 21 U.S.C. section 360bbb-3(b)(1), unless the authorization is terminated or revoked.  Performed at [facility], [address] [place], [place] [address]   Blood culture (routine x 2)     Status: None (Preliminary result)   Collection Time: 07/07/20  1:55 AM   Specimen: BLOOD  Result Value Ref Range Status   Specimen Description   Final    BLOOD RIGHT ANTECUBITAL Performed at [facility], [address],  [place], [place] [address]    Special Requests   Final    BOTTLES DRAWN AEROBIC AND ANAEROBIC Blood Culture results may not be optimal due to an excessive volume of blood received in culture bottles Performed at [facility], [address] [place], [place] [address]    Culture   Final    NO GROWTH 2 DAYS Performed at [facility], [address] [place], [place] [address]    Report Status PENDING  Incomplete  Culture, sputum-assessment     Status: None   Collection Time: 07/07/20  3:23 AM   Specimen: Sputum  Result Value Ref Range Status   Specimen Description SPU  Final   Special Requests NONE  Final   Sputum evaluation   Final    THIS SPECIMEN IS ACCEPTABLE FOR SPUTUM CULTURE Performed at [facility], [address] [place], [place] [address]    Report Status 07/07/2020 FINAL  Final  Culture, respiratory     Status: None   Collection Time: 07/07/20  3:23 AM   Specimen: Sputum  Result Value Ref Range Status   Specimen Description   Final    SPU Performed at [facility], [address], [place], [place] [address]    Special Requests   Final    NONE Reflexed from [id number] Performed at [facility], [address] [place], [place] [address]    Gram Stain   Final    ABUNDANT WBC PRESENT, PREDOMINANTLY PMN FEW GRAM POSITIVE COCCOBACILLUS Performed at [facility], [address] [place], [place] [address]    Culture MODERATE STREPTOCOCCUS PNEUMONIAE  Final   Report Status 07/09/2020 FINAL  Final   Organism ID, Bacteria STREPTOCOCCUS PNEUMONIAE  Final      Susceptibility   Streptococcus pneumoniae - MIC*    ERYTHROMYCIN >=8 RESISTANT  Resistant     LEVOFLOXACIN 0.5 SENSITIVE Sensitive     VANCOMYCIN 0.5 SENSITIVE Sensitive     PENICILLIN (meningitis) 1 RESISTANT Resistant     PENO - penicillin 1      PENICILLIN (non-meningitis) 1 SENSITIVE Sensitive     PENICILLIN (oral) 1 INTERMEDIATE  Intermediate     CEFTRIAXONE (non-meningitis) <=0.12 SENSITIVE Sensitive     CEFTRIAXONE (meningitis) <=0.12 SENSITIVE Sensitive     * MODERATE STREPTOCOCCUS PNEUMONIAE  Blood culture (routine x 2)     Status: None (Preliminary result)   Collection Time: 07/07/20 11:25 AM   Specimen: BLOOD RIGHT HAND  Result Value Ref Range Status   Specimen Description   Final    BLOOD RIGHT HAND Performed at [facility], [address], [place], [place] [address]    Special Requests   Final    BOTTLES DRAWN AEROBIC AND ANAEROBIC Blood Culture adequate volume Performed at [facility], [address] [place], [place] [address]    Culture   Final    NO GROWTH 2 DAYS Performed at [facility], [address] [place], [place] [address]    Report Status PENDING  Incomplete    Please note: You were cared for by a hospitalist during your hospital stay. Once you are discharged, your primary care physician will handle any further medical issues. Please note that NO REFILLS for any discharge medications will be authorized once you are discharged, as it is imperative that you return to your primary care physician (or establish a relationship with a primary care physician if you do not have one) for your post hospital discharge needs so that they can reassess your need for medications and monitor your lab values.    Time coordinating discharge: 40 minutes  SIGNED:   Burnadette Pop, MD  [facility] 07/10/2020, 9:34 AM Pager [phone]  If 7PM-7AM, please contact night-coverage [email or web address] Password [email or web address]

## 2020-07-12 LAB — CULTURE, BLOOD (ROUTINE X 2)
Culture: NO GROWTH
Culture: NO GROWTH
Special Requests: ADEQUATE

## 2020-08-24 ENCOUNTER — Other Ambulatory Visit: Payer: Self-pay

## 2020-08-24 ENCOUNTER — Emergency Department
Admission: EM | Admit: 2020-08-24 | Discharge: 2020-08-24 | Disposition: A | Discharge status: Home / Self Care | Payer: 59 | Attending: Emergency Medicine | Admitting: Emergency Medicine

## 2020-08-24 ENCOUNTER — Emergency Department: Payer: 59

## 2020-08-24 DIAGNOSIS — J189 Pneumonia, unspecified organism: Secondary | ICD-10-CM | POA: Insufficient documentation

## 2020-08-24 DIAGNOSIS — Z5321 Procedure and treatment not carried out due to patient leaving prior to being seen by health care provider: Secondary | ICD-10-CM | POA: Diagnosis not present

## 2020-08-24 MED ORDER — ALBUTEROL SULFATE HFA 108 (90 BASE) MCG/ACT IN AERS
2.0000 | INHALATION_SPRAY | RESPIRATORY_TRACT | Status: DC | PRN
  Filled 2020-08-24: qty 6.7

## 2020-08-24 NOTE — ED Notes (Signed)
Patient ambulatory to xray, speaking in full sentences, unlabored breathing a this time. Patient requesting blood work be drawn when he gets to room. States "I just want them to give me antibiotics".

## 2020-08-24 NOTE — ED Notes (Signed)
No answer when called several times from lobby 

## 2020-08-24 NOTE — ED Triage Notes (Signed)
Reports having pneumonia and being hospitalized for 6 days, been coughing up green phlegm and reports shob. Pt states "I've been forgetting simple things, it takes longer than normal. It's kin of the same thing as last time but the breathing is worse".

## 2020-08-25 ENCOUNTER — Emergency Department: Payer: 59

## 2020-08-25 ENCOUNTER — Emergency Department
Admission: EM | Admit: 2020-08-25 | Discharge: 2020-08-25 | Disposition: A | Discharge status: Home / Self Care | Payer: 59 | Attending: Emergency Medicine | Admitting: Emergency Medicine

## 2020-08-25 ENCOUNTER — Other Ambulatory Visit: Payer: Self-pay

## 2020-08-25 DIAGNOSIS — Z20822 Contact with and (suspected) exposure to covid-19: Secondary | ICD-10-CM | POA: Insufficient documentation

## 2020-08-25 DIAGNOSIS — R059 Cough, unspecified: Secondary | ICD-10-CM | POA: Diagnosis present

## 2020-08-25 DIAGNOSIS — I10 Essential (primary) hypertension: Secondary | ICD-10-CM | POA: Insufficient documentation

## 2020-08-25 DIAGNOSIS — F17210 Nicotine dependence, cigarettes, uncomplicated: Secondary | ICD-10-CM | POA: Insufficient documentation

## 2020-08-25 DIAGNOSIS — Z79899 Other long term (current) drug therapy: Secondary | ICD-10-CM | POA: Insufficient documentation

## 2020-08-25 DIAGNOSIS — J069 Acute upper respiratory infection, unspecified: Secondary | ICD-10-CM | POA: Insufficient documentation

## 2020-08-25 DIAGNOSIS — J45909 Unspecified asthma, uncomplicated: Secondary | ICD-10-CM | POA: Insufficient documentation

## 2020-08-25 LAB — CBC WITH DIFFERENTIAL/PLATELET
Abs Immature Granulocytes: 0.03 10*3/uL (ref 0.00–0.07)
Basophils Absolute: 0 10*3/uL (ref 0.0–0.1)
Basophils Relative: 0 %
Eosinophils Absolute: 0.7 10*3/uL — ABNORMAL HIGH (ref 0.0–0.5)
Eosinophils Relative: 6 %
HCT: 48.3 % (ref 39.0–52.0)
Hemoglobin: 16.5 g/dL (ref 13.0–17.0)
Immature Granulocytes: 0 %
Lymphocytes Relative: 11 %
Lymphs Abs: 1.4 10*3/uL (ref 0.7–4.0)
MCH: 31 pg (ref 26.0–34.0)
MCHC: 34.2 g/dL (ref 30.0–36.0)
MCV: 90.8 fL (ref 80.0–100.0)
Monocytes Absolute: 0.7 10*3/uL (ref 0.1–1.0)
Monocytes Relative: 5 %
Neutro Abs: 9.8 10*3/uL — ABNORMAL HIGH (ref 1.7–7.7)
Neutrophils Relative %: 78 %
Platelets: 242 10*3/uL (ref 150–400)
RBC: 5.32 MIL/uL (ref 4.22–5.81)
RDW: 13.1 % (ref 11.5–15.5)
WBC: 12.6 10*3/uL — ABNORMAL HIGH (ref 4.0–10.5)
nRBC: 0 % (ref 0.0–0.2)

## 2020-08-25 LAB — COMPREHENSIVE METABOLIC PANEL
ALT: 27 U/L (ref 0–44)
AST: 23 U/L (ref 15–41)
Albumin: 3.8 g/dL (ref 3.5–5.0)
Alkaline Phosphatase: 79 U/L (ref 38–126)
Anion gap: 11 (ref 5–15)
BUN: 8 mg/dL (ref 6–20)
CO2: 27 mmol/L (ref 22–32)
Calcium: 9.3 mg/dL (ref 8.9–10.3)
Chloride: 99 mmol/L (ref 98–111)
Creatinine, Ser: 0.62 mg/dL (ref 0.61–1.24)
GFR, Estimated: >60 mL/min (ref >60)
Glucose, Bld: 130 mg/dL — ABNORMAL HIGH (ref 70–99)
Potassium: 4.3 mmol/L (ref 3.5–5.1)
Sodium: 137 mmol/L (ref 135–145)
Total Bilirubin: 1.1 mg/dL (ref 0.3–1.2)
Total Protein: 7.3 g/dL (ref 6.5–8.1)

## 2020-08-25 LAB — TROPONIN I (HIGH SENSITIVITY): Troponin I (High Sensitivity): 3 ng/L (ref <18)

## 2020-08-25 LAB — SARS CORONAVIRUS 2 BY RT PCR (HOSPITAL ORDER, PERFORMED IN ~~LOC~~ HOSPITAL LAB): SARS Coronavirus 2: NEGATIVE

## 2020-08-25 MED ORDER — GUAIFENESIN ER 600 MG PO TB12: 600.0000 mg | ORAL_TABLET | Freq: Two times a day (BID) | ORAL | 0 refills | Status: AC

## 2020-08-25 MED ORDER — LEVOFLOXACIN 750 MG PO TABS: 750.0000 mg | ORAL_TABLET | Freq: Every day | ORAL | 0 refills | Status: AC

## 2020-08-25 MED ORDER — LEVOFLOXACIN 750 MG PO TABS
750.0000 mg | ORAL_TABLET | Freq: Once | ORAL | Status: AC
  Administered 2020-08-25: 750 mg via ORAL
  Filled 2020-08-25: qty 1

## 2020-08-25 NOTE — ED Provider Notes (Signed)
[facility] Department Provider Note  Time seen: 7:25 PM  I have reviewed the triage vital signs and the nursing notes.   HISTORY  Chief Complaint Shortness of Breath   HPI Roy Richmond is a 35 y.o. male with a past medical history of asthma, hypertension, presents to the emergency department for 1 week of cough now with mucus production.  According to the patient in early December he was admitted to the hospital with multifocal pneumonia.  Per record review it appears that the patient had multifocal pneumonia found on CTA of the chest with an elevated white blood cell count and a respiratory culture positive for strep pneumonia.  Patient required hospitalization at that time.  States since going home he had improved until approximately 1 week ago when the patient began coughing once again over the past 2 to 3 days he is now expressing thick mucus mostly white to yellow in color.  Denies any known fever, 99.2 currently.  No chest pain.  Patient was concerned he could have recurrent pneumonia so he came to the emergency department for evaluation.   Past Medical History:  Diagnosis Date  . Asthma   . HTN (hypertension)     Patient Active Problem List   Diagnosis Date Noted  . CAP (community acquired pneumonia) 07/07/2020  . Acute respiratory failure with hypoxia (HCC) 07/07/2020  . Multifocal pneumonia 07/07/2020  . GENERALIZED ANXIETY DISORDER 05/25/2010  . HYPERTENSION 02/12/2010  . OBSTRUCTIVE SLEEP APNEA 02/12/2010  . Asthma 09/04/2009    History reviewed. No pertinent surgical history.  Prior to Admission medications   Medication Sig Start Date End Date Taking? Authorizing Provider  albuterol (PROVENTIL HFA;VENTOLIN HFA) 108 (90 BASE) MCG/ACT inhaler Inhale 2 puffs into the lungs every 6 (six) hours as needed for wheezing. 08/16/13   Oretha Milch, MD  amLODipine (NORVASC) 5 MG tablet Take 1 tablet (5 mg total) by mouth daily. 07/10/20  07/10/21  Burnadette Pop, MD  budesonide-formoterol (SYMBICORT) 160-4.5 MCG/ACT inhaler Inhale 2 puffs into the lungs 2 (two) times daily. 07/26/13   Oretha Milch, MD  Buprenorphine HCl-Naloxone HCl 8-2 MG FILM Place 1 Film under the tongue in the morning, at noon, and at bedtime. 05/31/20   [provider]  cetirizine-pseudoephedrine (ZYRTEC-D) 5-120 MG tablet Take 1 tablet by mouth 2 (two) times daily.    [provider]  guaiFENesin (ROBITUSSIN) 100 MG/5ML SOLN Take 5 mLs (100 mg total) by mouth every 4 (four) hours as needed for cough or to loosen phlegm. 07/10/20   Burnadette Pop, MD  naproxen sodium (ALEVE) 220 MG tablet Take 220 mg by mouth 2 (two) times daily as needed (pain).    [provider]    Allergies  Allergen Reactions  . Atenolol Other (See Comments)    Headache   . Tramadol Other (See Comments)    Headache     Family History  Family history unknown: Yes    Social History Social History   Tobacco Use  . Smoking status: Current Every Day Smoker    Packs/day: 0.02    Years: 10.00    Pack years: 0.20    Types: Cigarettes  . Smokeless tobacco: Never Used  Substance Use Topics  . Alcohol use: Yes    Comment: once a month (2014)  . Drug use: No    Review of Systems Constitutional: Negative for fever. Cardiovascular: Negative for chest pain. Respiratory: Negative for shortness of breath.  Positive for cough.  Positive for mucus production. Gastrointestinal: Negative for abdominal pain Genitourinary: Negative for urinary compaints Musculoskeletal: Negative for musculoskeletal complaints Neurological: Negative for headache All other ROS negative  ____________________________________________   PHYSICAL EXAM:  VITAL SIGNS: ED Triage Vitals  Enc Vitals Group     BP 08/25/20 1730 (!) 157/83     Pulse Rate 08/25/20 1730 (!) 103     Resp 08/25/20 1730 20     Temp 08/25/20 1730 99.2 F (37.3 C)     Temp Source 08/25/20 1730  Oral     SpO2 08/25/20 1730 96 %     Weight 08/25/20 1731 230 lb (104.3 kg)     Height 08/25/20 1731 6' (1.829 m)     Head Circumference --      Peak Flow --      Pain Score 08/25/20 1731 4     Pain Loc --      Pain Edu? --      Excl. in [place]? --    Constitutional: Alert and oriented. Well appearing and in no distress. Eyes: Normal exam ENT      Head: Normocephalic and atraumatic.      Mouth/Throat: Mucous membranes are moist. Cardiovascular: Normal rate, regular rhythm.  Respiratory: Normal respiratory rate.  Mild expiratory wheezes with mild rhonchi bilaterally. Gastrointestinal: Soft and nontender. No distention.  Musculoskeletal: Nontender with normal range of motion in all extremities.  Neurologic:  Normal speech and language. No gross focal neurologic deficits  Skin:  Skin is warm, dry and intact.  Psychiatric: Mood and affect are normal.  ____________________________________________    EKG  EKG viewed and interpreted by myself shows normal sinus rhythm at 94 bpm with a narrow QRS, right axis deviation, largely normal intervals and nonspecific but no concerning ST changes.  ____________________________________________    RADIOLOGY  Chest x-ray is negative  ____________________________________________   INITIAL IMPRESSION / ASSESSMENT AND PLAN / ED COURSE  Pertinent labs & imaging results that were available during my care of the patient were reviewed by me and considered in my medical decision making (see chart for details).   Patient presents to the emergency department for cough with mucus production.  I reviewed the patient's chart he does have a significant history of early December requiring hospitalization for Streptococcus pneumonia multifocal pneumonia.  Given the patient's low-grade temperature cough and mucus production we will swab for Covid.  Chest x-ray is negative however patient has mild rhonchi and wheeze bilaterally.  I discussed with the patient to  use his albuterol inhaler every 4-6 hours at home.  We will cover with antibiotics.  I reviewed the last streptococcal culture which was resistant to macrolides.  We will use Levaquin with the patient was sensitive to fluoroquinolones during his last admission.  We will also prescribe Mucinex.  I discussed with the patient return precautions for any worsening cough congestion, shortness of breath or development of high fever.  Patient agreeable to plan of care.  Roy Richmond was evaluated in Emergency Department on 08/25/2020 for the symptoms described in the history of present illness. He was evaluated in the context of the global COVID-19 pandemic, which necessitated consideration that the patient might be at risk for infection with the SARS-CoV-2 virus that causes COVID-19. Institutional protocols and algorithms that pertain to the evaluation of patients at risk for COVID-19 are in a state of rapid change based on information released by regulatory bodies including the CDC and federal and state organizations. These policies and algorithms  were followed during the patient's care in the ED.  ____________________________________________   FINAL CLINICAL IMPRESSION(S) / ED DIAGNOSES  Upper respiratory infection   Minna Antis, MD 08/25/20 2831

## 2020-08-25 NOTE — ED Notes (Signed)
No peripheral IV placed this visit.    Discharge instructions reviewed with patient. Questions fielded by this RN. Patient verbalizes understanding of instructions. Patient discharged home in stable condition per provider. No acute distress noted at time of discharge.    

## 2020-08-25 NOTE — ED Notes (Signed)
Pt reports coughing up green mucus with increased SOB with mild exertion - coughing to the point of dizziness, reports same s/sx from 12/9 when pt was hospitalized for "double pneumonia" pt was taking ABX and steroids

## 2020-08-25 NOTE — ED Triage Notes (Signed)
Pt to ER via POV with complaints of shortness of breath, and productive cough with green phlegm x4 days. Unknown if any fevers at home, does reports some chills. Denies any known covid positive contacts.  Pt reports having bilateral pneumonia 5 weeks ago that required hospitalization, reports improved symptoms but is now starting to feel the same as before.

## 2021-12-11 ENCOUNTER — Encounter: Payer: Self-pay | Admitting: Emergency Medicine

## 2021-12-11 ENCOUNTER — Other Ambulatory Visit: Payer: Self-pay

## 2021-12-11 DIAGNOSIS — I10 Essential (primary) hypertension: Secondary | ICD-10-CM | POA: Insufficient documentation

## 2021-12-11 DIAGNOSIS — F10129 Alcohol abuse with intoxication, unspecified: Secondary | ICD-10-CM | POA: Insufficient documentation

## 2021-12-11 DIAGNOSIS — Z20822 Contact with and (suspected) exposure to covid-19: Secondary | ICD-10-CM | POA: Insufficient documentation

## 2021-12-11 DIAGNOSIS — R519 Headache, unspecified: Secondary | ICD-10-CM | POA: Insufficient documentation

## 2021-12-11 NOTE — ED Triage Notes (Addendum)
Pt reports that he was seen at [facility] ER recently and rx steroids and antibiotics for poss Lymes disease; st he has completed these meds and has persistent rt sided HA; denies any other accomp symptoms at this time; pt also reports he stopped at the store before arriving and "drank a 6 pack" ?

## 2021-12-12 ENCOUNTER — Emergency Department: Payer: 59

## 2021-12-12 ENCOUNTER — Emergency Department
Admission: EM | Admit: 2021-12-12 | Discharge: 2021-12-12 | Disposition: A | Discharge status: Home / Self Care | Payer: 59 | Attending: Emergency Medicine | Admitting: Emergency Medicine

## 2021-12-12 DIAGNOSIS — F10929 Alcohol use, unspecified with intoxication, unspecified: Secondary | ICD-10-CM

## 2021-12-12 DIAGNOSIS — W57XXXA Bitten or stung by nonvenomous insect and other nonvenomous arthropods, initial encounter: Secondary | ICD-10-CM

## 2021-12-12 DIAGNOSIS — R519 Headache, unspecified: Secondary | ICD-10-CM

## 2021-12-12 LAB — RESP PANEL BY RT-PCR (FLU A&B, COVID) ARPGX2
Influenza A by PCR: NEGATIVE
Influenza B by PCR: NEGATIVE
SARS Coronavirus 2 by RT PCR: NEGATIVE

## 2021-12-12 LAB — CBC WITH DIFFERENTIAL/PLATELET
Abs Immature Granulocytes: 0.07 10*3/uL (ref 0.00–0.07)
Basophils Absolute: 0.1 10*3/uL (ref 0.0–0.1)
Basophils Relative: 1 %
Eosinophils Absolute: 0.5 10*3/uL (ref 0.0–0.5)
Eosinophils Relative: 5 %
HCT: 45.4 % (ref 39.0–52.0)
Hemoglobin: 15.7 g/dL (ref 13.0–17.0)
Immature Granulocytes: 1 %
Lymphocytes Relative: 33 %
Lymphs Abs: 3.3 10*3/uL (ref 0.7–4.0)
MCH: 31.3 pg (ref 26.0–34.0)
MCHC: 34.6 g/dL (ref 30.0–36.0)
MCV: 90.4 fL (ref 80.0–100.0)
Monocytes Absolute: 0.5 10*3/uL (ref 0.1–1.0)
Monocytes Relative: 5 %
Neutro Abs: 5.6 10*3/uL (ref 1.7–7.7)
Neutrophils Relative %: 55 %
Platelets: 199 10*3/uL (ref 150–400)
RBC: 5.02 MIL/uL (ref 4.22–5.81)
RDW: 13 % (ref 11.5–15.5)
WBC: 10 10*3/uL (ref 4.0–10.5)
nRBC: 0 % (ref 0.0–0.2)

## 2021-12-12 LAB — COMPREHENSIVE METABOLIC PANEL
ALT: 32 U/L (ref 0–44)
AST: 22 U/L (ref 15–41)
Albumin: 3.9 g/dL (ref 3.5–5.0)
Alkaline Phosphatase: 83 U/L (ref 38–126)
Anion gap: 8 (ref 5–15)
BUN: 15 mg/dL (ref 6–20)
CO2: 25 mmol/L (ref 22–32)
Calcium: 8.6 mg/dL — ABNORMAL LOW (ref 8.9–10.3)
Chloride: 104 mmol/L (ref 98–111)
Creatinine, Ser: 0.61 mg/dL (ref 0.61–1.24)
GFR, Estimated: >60 mL/min (ref >60)
Glucose, Bld: 112 mg/dL — ABNORMAL HIGH (ref 70–99)
Potassium: 3.8 mmol/L (ref 3.5–5.1)
Sodium: 137 mmol/L (ref 135–145)
Total Bilirubin: 0.3 mg/dL (ref 0.3–1.2)
Total Protein: 7 g/dL (ref 6.5–8.1)

## 2021-12-12 LAB — LIPASE, BLOOD: Lipase: 29 U/L (ref 11–51)

## 2021-12-12 LAB — ETHANOL: Alcohol, Ethyl (B): 147 mg/dL — ABNORMAL HIGH (ref <10)

## 2021-12-12 MED ORDER — DOXYCYCLINE HYCLATE 100 MG PO CAPS: 100.0000 mg | ORAL_CAPSULE | Freq: Two times a day (BID) | ORAL | 0 refills | Status: AC

## 2021-12-12 NOTE — ED Notes (Signed)
This RN went to D/C pt and pt started to argue with this RN stating "I need a ABX and steroid for my breathing". Pt is NAD. Pt is refusing D/C vitals at this time. Dr. Don Perking at bedside and explained pt's plan of care and status and that he was recently placed on ABX. Pt was educated to cover his body up to keep the ticks off of him to prevent further tick bites.  ?

## 2021-12-12 NOTE — Discharge Instructions (Signed)

## 2021-12-12 NOTE — ED Notes (Signed)
Attempts to obtain vitals/monitoring on pt. Pt states he does not want monitoring applied. Educated pt on need/benefits of consistent vital signs. Pt continues to request vitals not to be taken.  ?

## 2021-12-12 NOTE — ED Provider Notes (Addendum)
? ?[facility] ?Provider Note ? ? ? Event Date/Time  ? First MD Initiated Contact with Patient 12/12/21 0028   ?  (approximate) ? ? ?History  ? ?Headache ? ? ?HPI ? ?Roy Richmond is a 36 y.o. male with a history of of alcoholism who presents for evaluation of HA. Patient was found by triage nurse slumped over the steering wheel in his car with several cans of beer all over the car. Patient asked to stay in the car in the parking lot but was told he had to check in or leave. Patient checked in. Patient seen leave and go to his car and return several times. Once brought to a room, patient reports not feeling well for 1 month.  He reports a constant generalized headache, fatigue, cough, congestion.  She reports greater than 20 tick bites a day.  He is concerned that he may have Lyme disease or any other tickborne illness.  Recently seen at an outside hospital a week ago and treated with doxycycline with no changes in his symptoms.  He was tested for Lyme disease during that visit which was negative.  He denies chest pain or shortness of breath.  He does have a history of alcohol abuse. ?  ? ? ?Past Medical History:  ?Diagnosis Date  ? Asthma   ? HTN (hypertension)   ? ? ?History reviewed. No pertinent surgical history. ? ? ?Physical Exam  ? ?Triage Vital Signs: ?ED Triage Vitals  ?Enc Vitals Group  ?   BP 12/11/21 2315 (!) 151/89  ?   Pulse Rate 12/11/21 2315 98  ?   Resp 12/11/21 2315 18  ?   Temp 12/11/21 2315 98 ?F (36.7 ?C)  ?   Temp Source 12/11/21 2315 Oral  ?   SpO2 12/11/21 2315 90 %  ?   Weight 12/11/21 2315 240 lb (108.9 kg)  ?   Height 12/11/21 2315 6' (1.829 m)  ?   Head Circumference --   ?   Peak Flow --   ?   Pain Score 12/11/21 2320 5  ?   Pain Loc --   ?   Pain Edu? --   ?   Excl. in [place]? --   ? ? ?Most recent vital signs: ?Vitals:  ? 12/11/21 2315  ?BP: (!) 151/89  ?Pulse: 98  ?Resp: 18  ?Temp: 98 ?F (36.7 ?C)  ?SpO2: 90%  ? ? ? ?Constitutional: Alert and oriented,  intoxicated, disheveled, covered in ticks. ?HEENT: ?     Head: Normocephalic and atraumatic.    ?     Eyes: Conjunctivae are normal. Sclera is non-icteric.  ?     Mouth/Throat: Mucous membranes are moist.  ?     Neck: Supple with no signs of meningismus. ?Cardiovascular: Regular rate and rhythm. No murmurs, gallops, or rubs. 2+ symmetrical distal pulses are present in all extremities.  ?Respiratory: Normal respiratory effort. Lungs are clear to auscultation bilaterally.  ?Gastrointestinal: Soft, non tender, and non distended with positive bowel sounds. No rebound or guarding. ?Musculoskeletal:  No edema, cyanosis, or erythema of extremities. ?Neurologic: Normal speech and language. Face is symmetric. Moving all extremities. No gross focal neurologic deficits are appreciated. ?Skin: Skin is warm, dry and intact. No rash noted. ?Psychiatric: Mood and affect are normal. Speech and behavior are normal. ? ?ED Results / Procedures / Treatments  ? ?Labs ?(all labs ordered are listed, but only abnormal results are displayed) ?Labs Reviewed  ?COMPREHENSIVE METABOLIC PANEL -  Abnormal; Notable for the following components:  ?    Result Value  ? Glucose, Bld 112 (*)   ? Calcium 8.6 (*)   ? All other components within normal limits  ?ETHANOL - Abnormal; Notable for the following components:  ? Alcohol, Ethyl (B) 147 (*)   ? All other components within normal limits  ?RESP PANEL BY RT-PCR (FLU A&B, COVID) [id number]  ?CBC WITH DIFFERENTIAL/PLATELET  ?LIPASE, BLOOD  ? ? ? ?EKG ? ?none ? ? ?RADIOLOGY ?I, Rudene Re, attending MD, have personally viewed and interpreted the images obtained during this visit as below: ? ?CT head is negative ? ? ?___________________________________________________ ?Interpretation by Radiologist:  ?CT Head Wo Contrast ? ?Result Date: 12/12/2021 ?CLINICAL DATA:  Headache EXAM: CT HEAD WITHOUT CONTRAST TECHNIQUE: Contiguous axial images were obtained from the base of the skull through the vertex without  intravenous contrast. RADIATION DOSE REDUCTION: This exam was performed according to the departmental dose-optimization program which includes automated exposure control, adjustment of the mA and/or kV according to patient size and/or use of iterative reconstruction technique. COMPARISON:  None Available. FINDINGS: Brain: No evidence of acute infarction, hemorrhage, hydrocephalus, extra-axial collection or mass lesion/mass effect. Vascular: No hyperdense vessel or unexpected calcification. Skull: Normal. Negative for fracture or focal lesion. Sinuses/Orbits: The visualized paranasal sinuses are essentially clear. The mastoid air cells are unopacified. Other: None. IMPRESSION: Normal head CT. Electronically Signed   By: Julian Hy M.D.   On: 12/12/2021 01:46   ? ? ? ? ?PROCEDURES: ? ?Critical Care performed: No ? ?Procedures ? ? ? ?IMPRESSION / MDM / ASSESSMENT AND PLAN / ED COURSE  ?I reviewed the triage vital signs and the nursing notes. ? ?36 y.o. male with a history of of alcoholism and opiate dependence on Suboxone who presents for evaluation of HA. Patient initially did not want to get checked in after being found sleeping in his car in the parking lot with lots of beer cans.  Due to concerns of intoxication patient was encouraged to come to be evaluated.  Patient has several complaints including fatigue, cough, headache which have been ongoing for a month.  He was seen twice at [facility] emergency department on 5/9 and 5/10 for the same complaints.  Had blood work done and Lyme tests which were negative.  Was treated with doxycycline.  During my examination patient is disheveled, mildly intoxicated but with steady gait, covered in ticks.  He has normal vital signs, is otherwise neurologically intact with no signs of trauma.  I did order repeat blood work including CBC, CMP, lipase, ethanol, and COVID swab.  I also ordered a head CT to rule out any intracranial bleed.  I did review the results of the labs  from last week including CMP, troponin, Lyme disease serology, and CBC which were all unremarkable.  As soon as I finished my initial evaluation and placed orders,  patient went to the bathroom.  He was then seen by triage nurse walking out of the ER going back to his car.  At that point to triage nurse called the police to report patient driving under the influence.  Patient was admitted by hospital security and [place] [employer] at the parking lot. No labs or imaging were done. No indication for IVC at this time as long as patient is not driving under the influence. Low suspicion for an emergent etiology of patient's symptoms requiring emergent imaging/ labs needed. Patient was encouraged to return for further evaluation or f/u with his  PCP who he has seen for this complaints within the last month. ? ? ?_________________________ ?1:31 AM on 12/12/2021 ?----------------------------------------- ?Patient escorted back into ED by officers and allows with medical evaluation. Will proceed with initial plan. ? ? ?_________________________ ?3:52 AM on 12/12/2021 ?----------------------------------------- ?Head CT negative for acute pathology.  Labs with no signs of anemia, leukocytosis, lites significant electrolyte derangements, AKI, alcoholic ketoacidosis.  COVID and flu negative.  Alcohol level 47 with normal LFTs and lipase.  Patient's symptoms consistent with alcohol abuse versus viral syndrome.  We will keep patient in the ED until he is sober ? ?MEDICATIONS GIVEN IN ED: ?Medications - No data to display ? ? ?EMR reviewed records from prior visits to an outside ER last week ? ? ? ?FINAL CLINICAL IMPRESSION(S) / ED DIAGNOSES  ? ?Final diagnoses:  ?Acute nonintractable headache, unspecified headache type  ?Alcoholic intoxication with complication ([place])  ? ? ? ?Rx / DC Orders  ? ?ED Discharge Orders   ? ? None  ? ?  ? ? ? ?Note:  This document was prepared using Dragon voice recognition software and may include  unintentional dictation errors. ? ? ?Please note:  Patient was evaluated in Emergency Department today for the symptoms described in the history of present illness. Patient was evaluated in the context of the global COV

## 2021-12-12 NOTE — ED Notes (Signed)
No answer in lobby when called for room.  Found pt outside and he states he will have a smoke before coming in. ?

## 2021-12-12 NOTE — ED Notes (Signed)
Pt walked to restroom with steady gait.  When pt walked out of bathroom, this Clinical research associate and Erie Noe, charge RN asked this pt if he had any alcohol on his person.  Pt stated "I got a whole case of alcohol on me.  That was a stupid question.  Ask stupid questions, you get stupid answers."  Pt then stated that we "are not doing anything for me, so I'm going to leave."  Pt then proceeded to walk to lobby, and out to parking lot.  [facility] [employer] notified that pt was walking out to his vehicle and is clearly inebriated.   ?

## 2021-12-12 NOTE — ED Notes (Signed)
Writer out to lobby to speak with pt. Writer enquires on pts decision to be treated and pt agreeable. Pt taken to room 1 and apologizes to Clinical research associate as well as other staff for his behavior earlier. Pt asked to change into gown and socks to help contain loose/live ticks prior to CT scan and agrees to do so.  ?

## 2024-01-25 IMAGING — CT CT HEAD W/O CM
4 series · 16 of 47 positions shown, 18 images · non-contrast
Comparison: None Available.

CLINICAL DATA: Headache



[Series 2: head wo · axial · 0.46mm/px · z∈[-73,+42]mm · 7 of 31 slices shown, 9 images]
[im 4/31  brain]
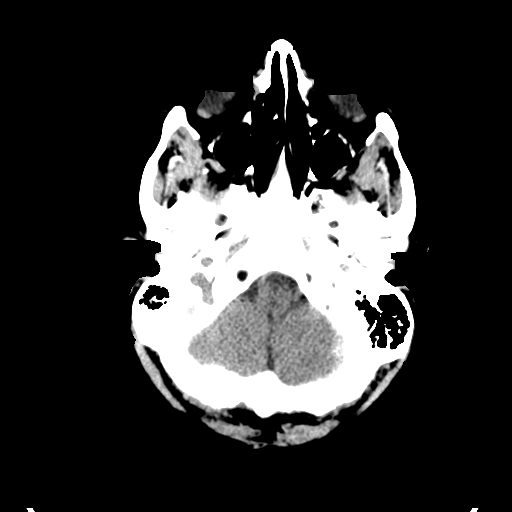
[im 4/31  bone]
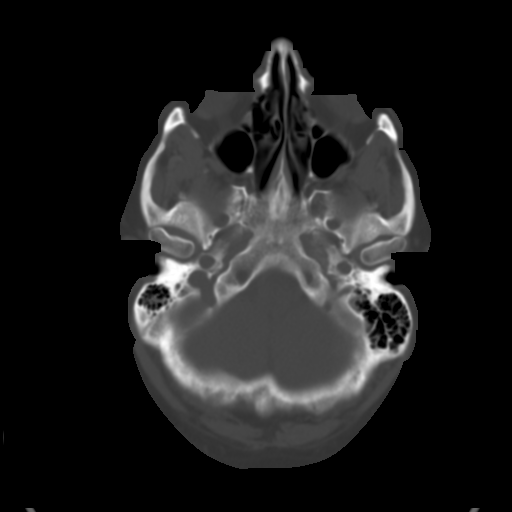
[im 8/31  brain]
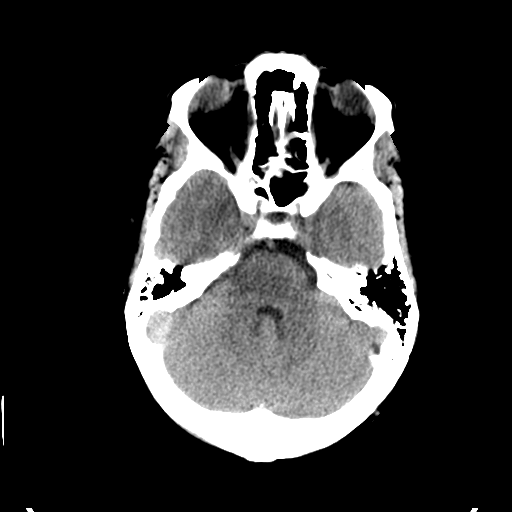
[im 12/31  brain]
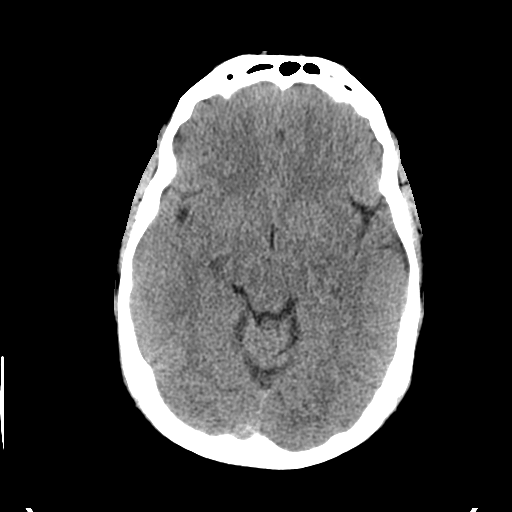
[im 16/31  brain]
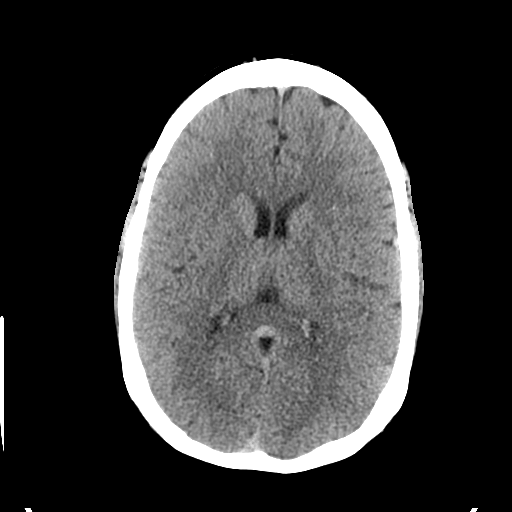
[im 19/31  brain]
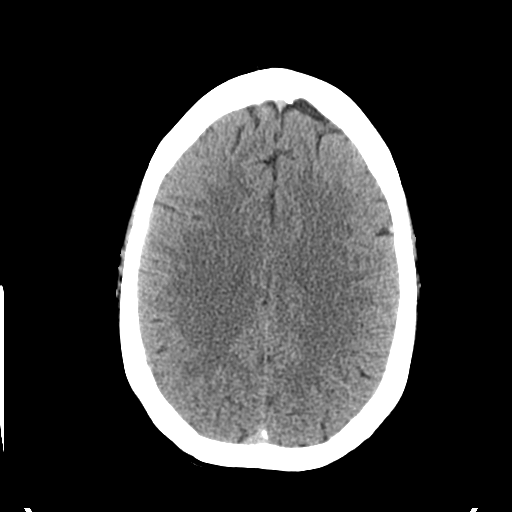
[im 19/31  bone]
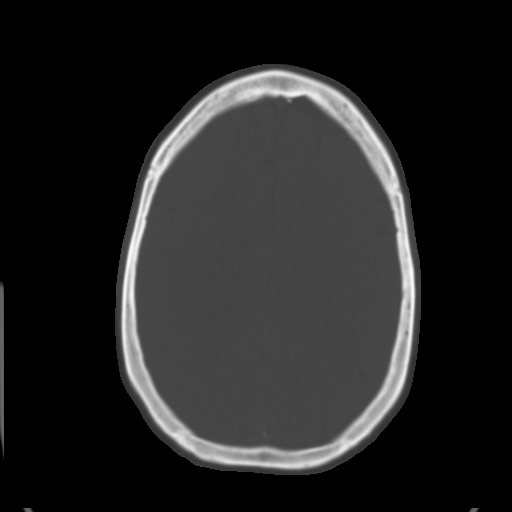
[im 23/31  brain]
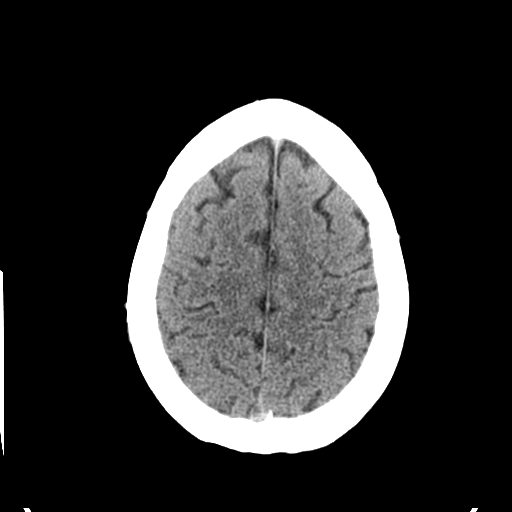
[im 27/31  brain]
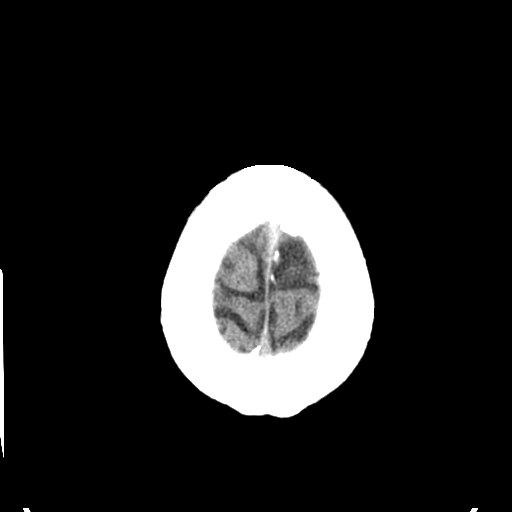

[Series 3: head bone · axial · 0.46mm/px · z∈[-74,-44]mm · 3 of 77 slices shown]
[im 8/77  bone]
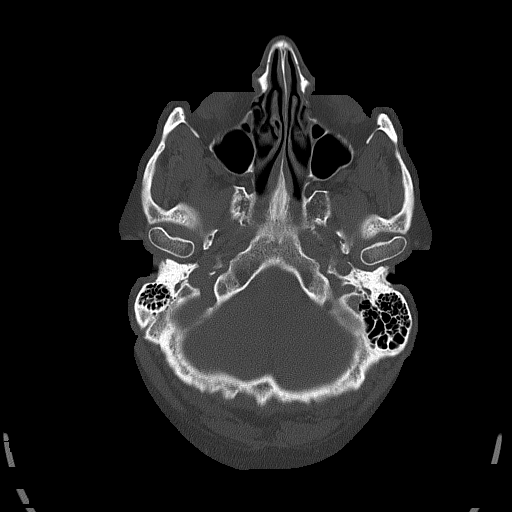
[im 16/77  bone]
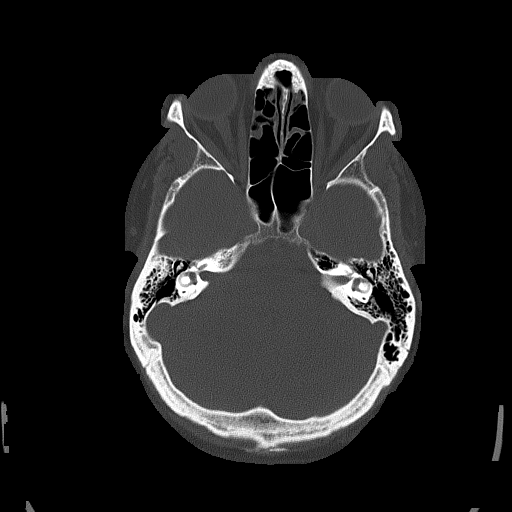
[im 23/77  bone]
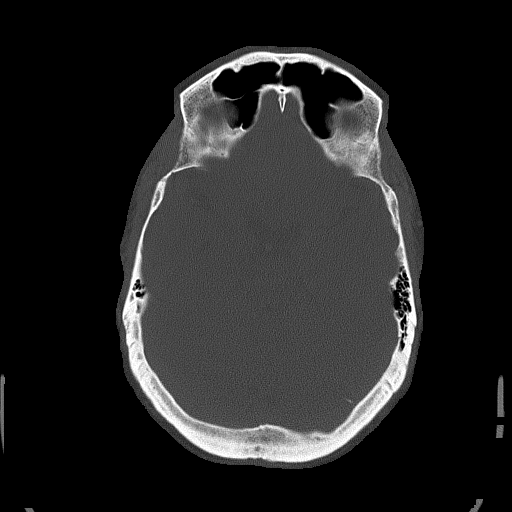

[Series 4: coronal soft tissue · coronal · 0.29mm/px · 3 of 68 slices shown]
[im 23/68  brain]
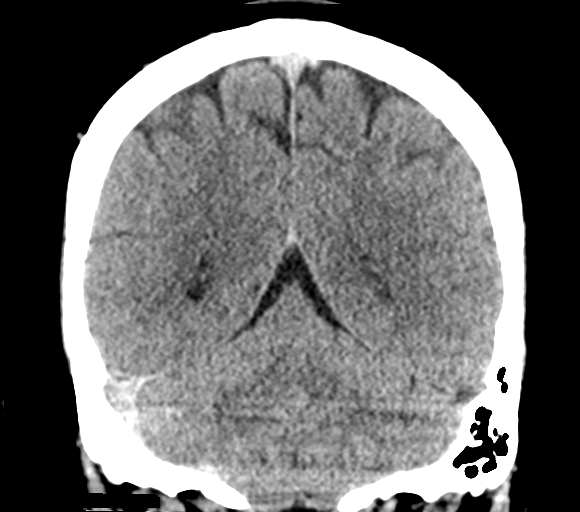
[im 30/68  brain]
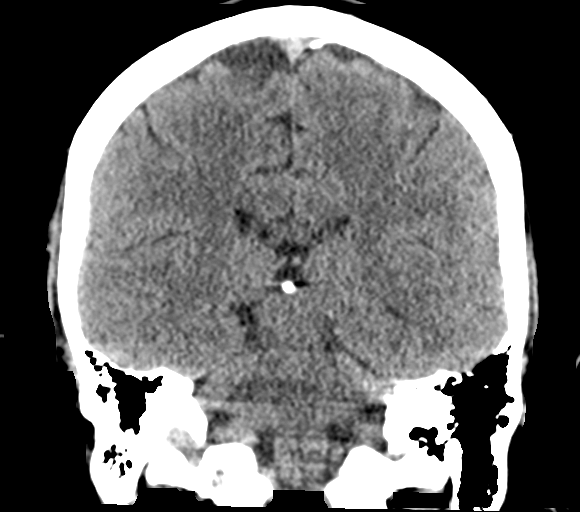
[im 38/68  brain]
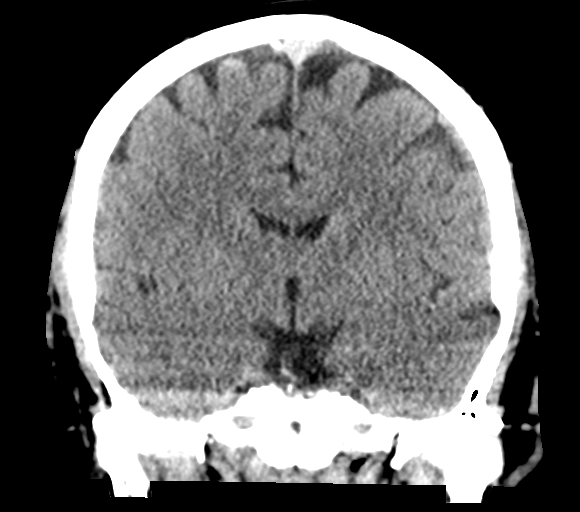

[Series 5: sagittal soft tissue · sagittal · 0.29mm/px · 3 of 51 slices shown]
[im 17/51  brain]
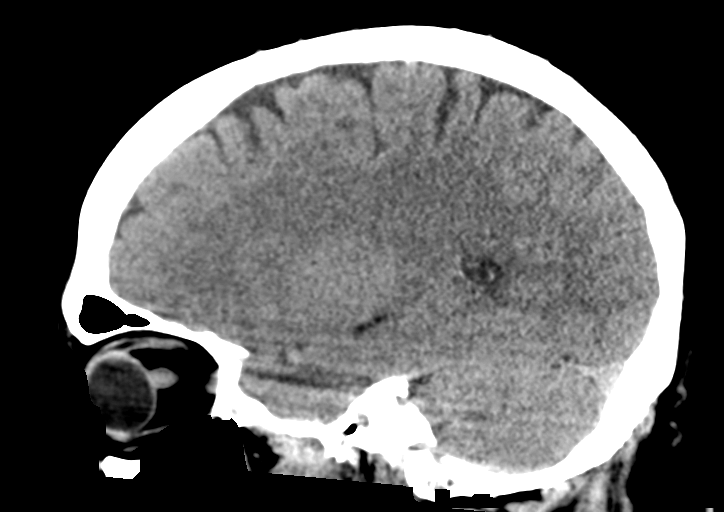
[im 26/51  brain]
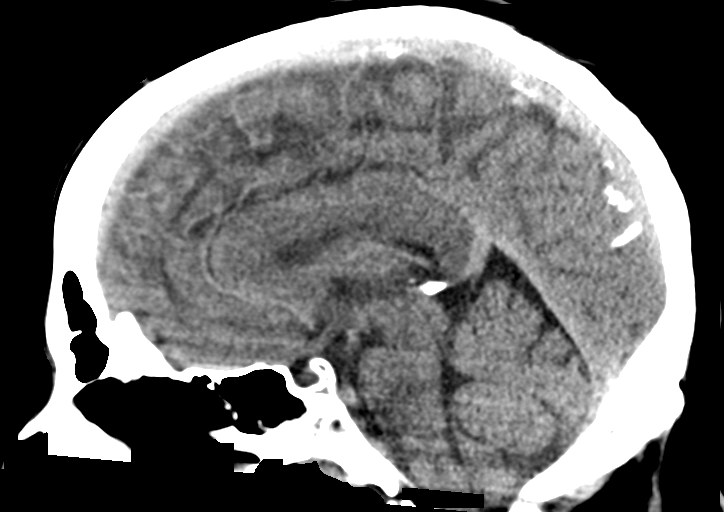
[im 34/51  brain]
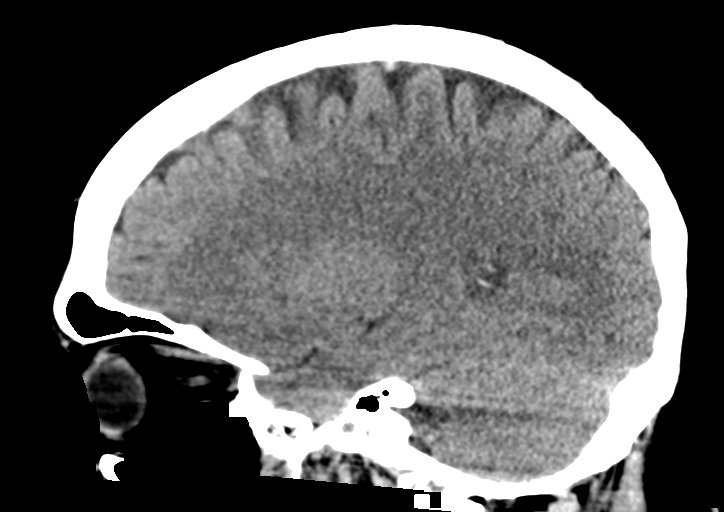

[16 of 47 positions shown; findings below may reference images not displayed]

FINDINGS: Brain: No evidence of acute infarction, hemorrhage, hydrocephalus,
extra-axial collection or mass lesion/mass effect.

Vascular: No hyperdense vessel or unexpected calcification.

Skull: Normal. Negative for fracture or focal lesion.

Sinuses/Orbits: The visualized paranasal sinuses are essentially
clear. The mastoid air cells are unopacified.

Other: None.
IMPRESSION: Normal head CT.
